# Patient Record
Sex: Male | Born: 1960 | Hispanic: Yes | Marital: Married | Smoking: Never smoker
Health system: Southern US, Community
[De-identification: ages and names within clinical notes are randomized; demographics above are authoritative.]

## PROBLEM LIST (undated history)

## (undated) ENCOUNTER — Emergency Department: Admission: EM | Payer: Self-pay

## (undated) DIAGNOSIS — I10 Essential (primary) hypertension: Secondary | ICD-10-CM

## (undated) HISTORY — PX: APPENDECTOMY: SHX54

---

## 2006-09-12 ENCOUNTER — Emergency Department: Payer: Self-pay

## 2010-03-02 ENCOUNTER — Ambulatory Visit: Payer: Self-pay | Admitting: Internal Medicine

## 2010-07-18 ENCOUNTER — Observation Stay: Payer: Self-pay | Admitting: Vascular Surgery

## 2010-07-22 LAB — PATHOLOGY REPORT

## 2011-05-23 IMAGING — US ABDOMEN ULTRASOUND
1 series · 17 of 25 positions shown · non-contrast
Comparison: none

REASON FOR EXAM: RUQ pain
COMMENTS:   May transport without cardiac monitor

[Series 1: abdomen ultrasound · 17 of 55 slices shown]
[im 1/55]
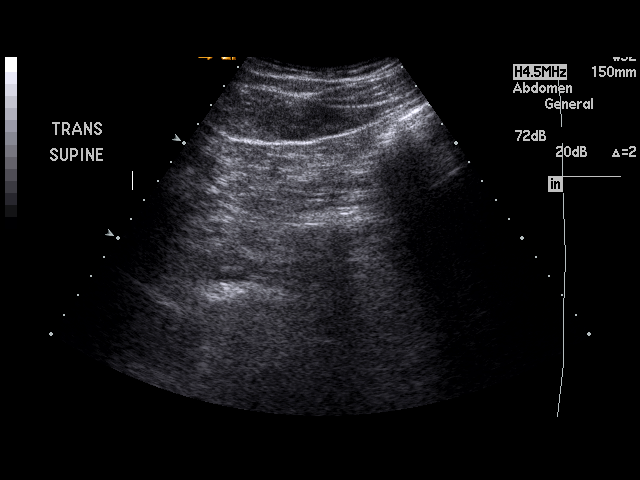
[im 5/55]
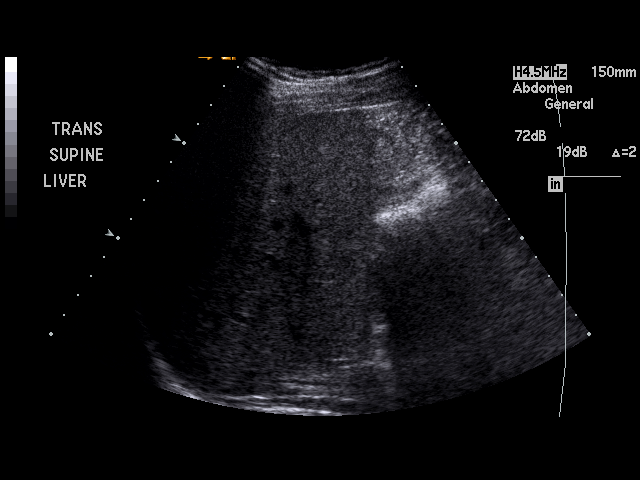
[im 7/55]
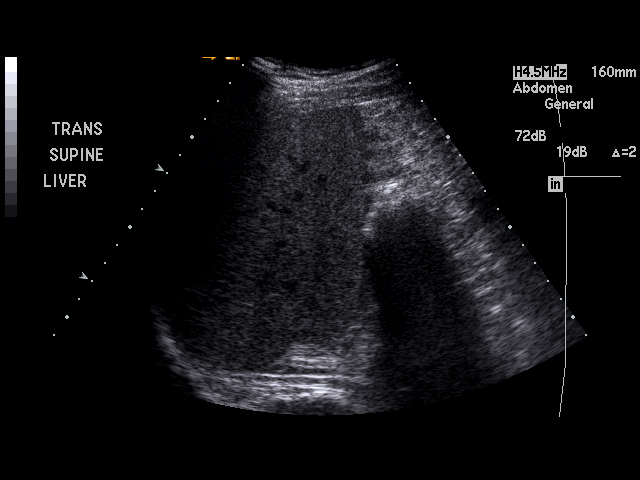
[im 12/55]
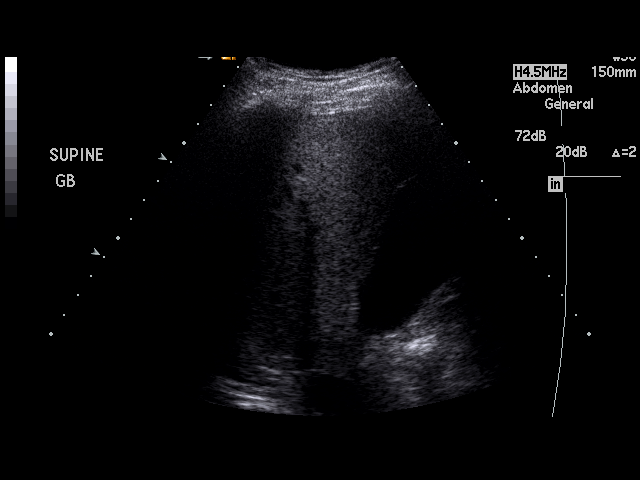
[im 14/55]
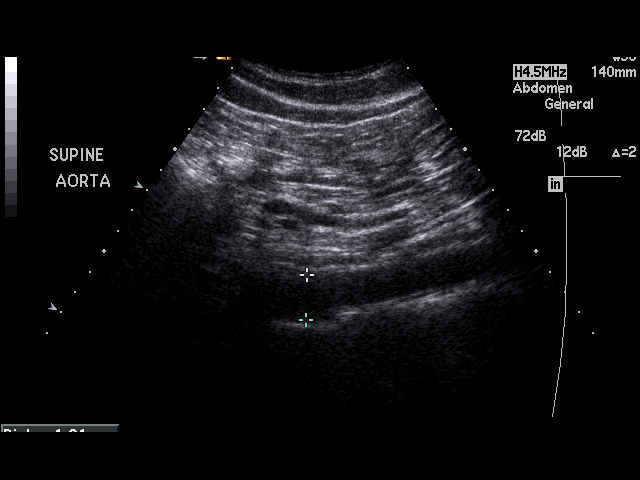
[im 19/55]
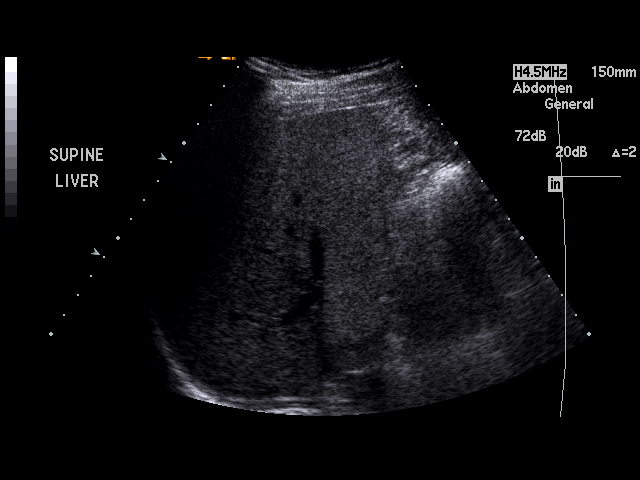
[im 21/55]
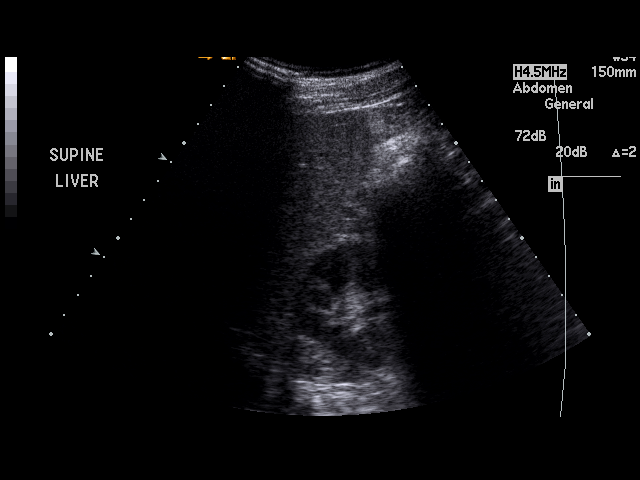
[im 25/55]
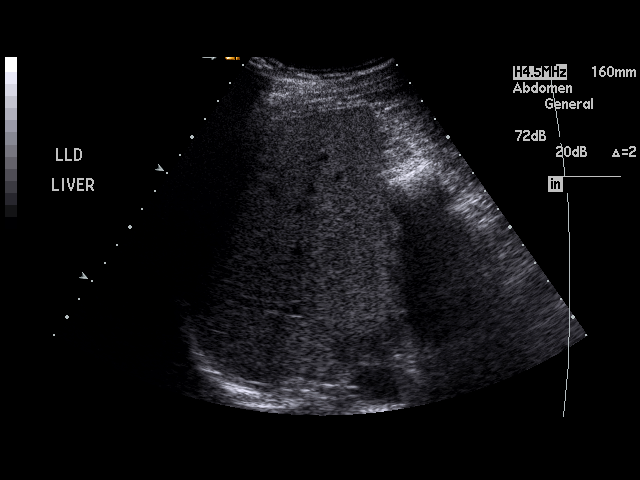
[im 28/55]
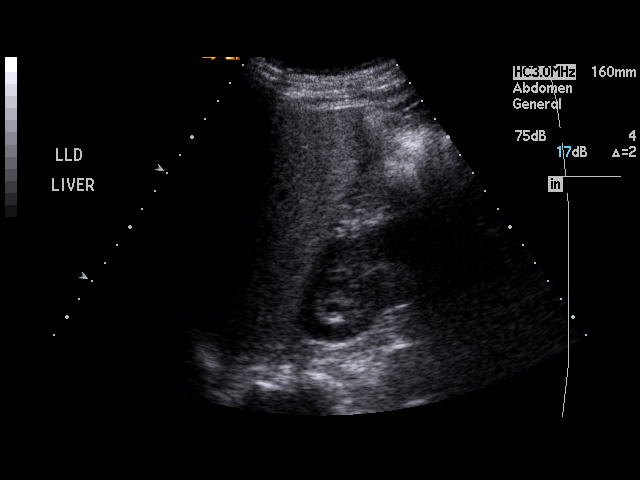
[im 30/55]
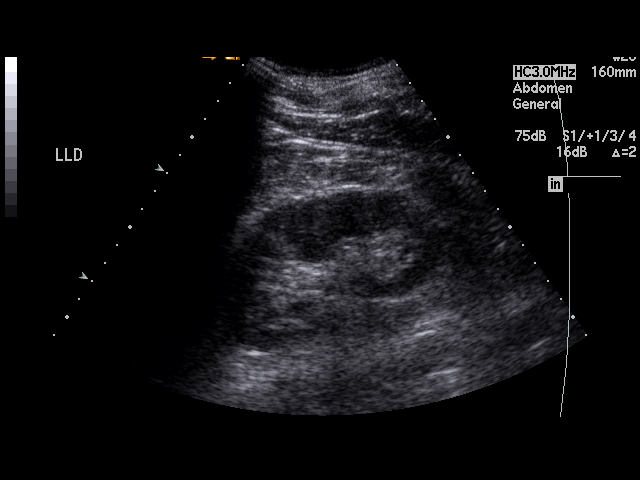
[im 34/55]
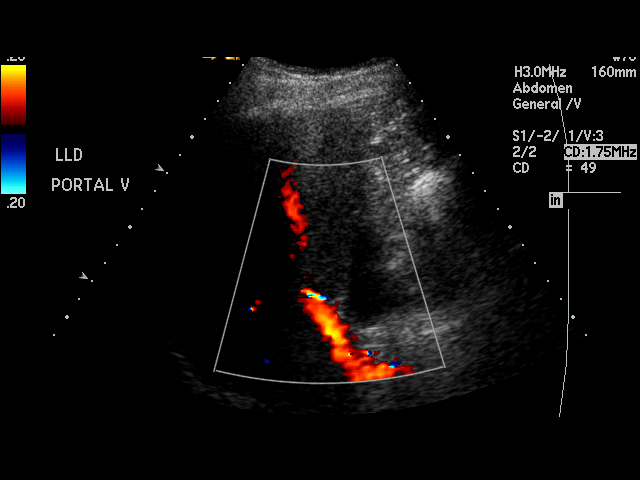
[im 37/55]
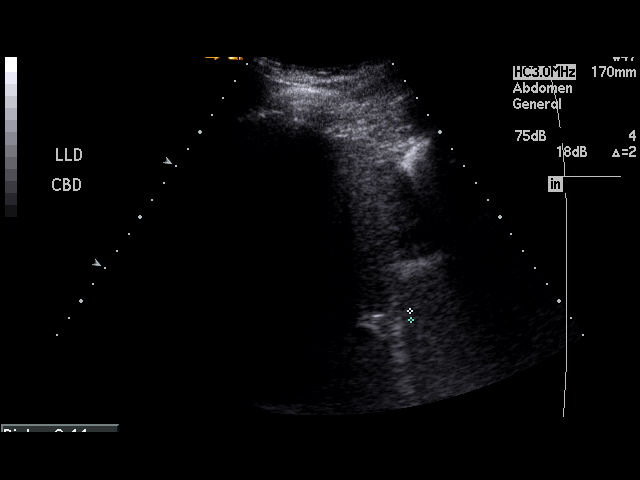
[im 41/55]
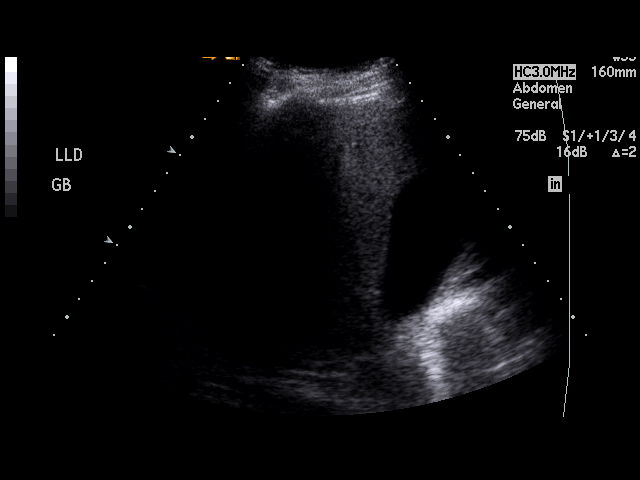
[im 43/55]
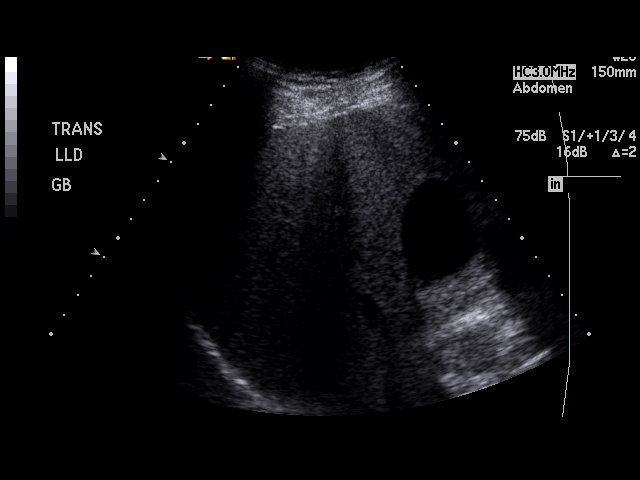
[im 48/55]
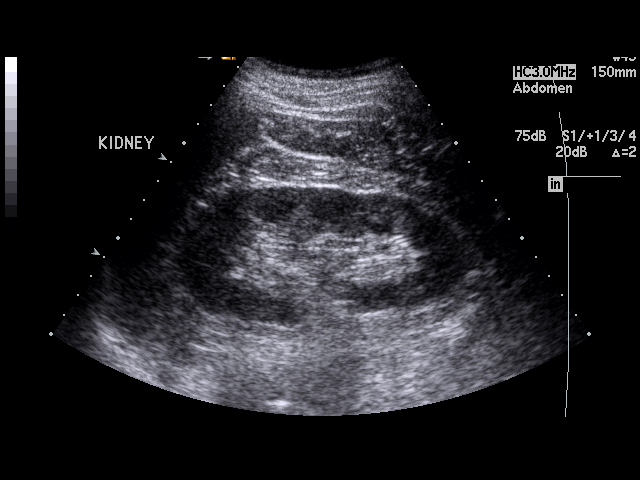
[im 50/55]
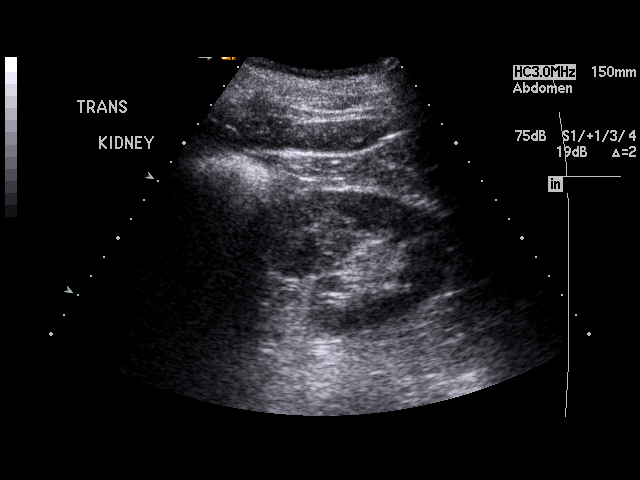
[im 55/55]
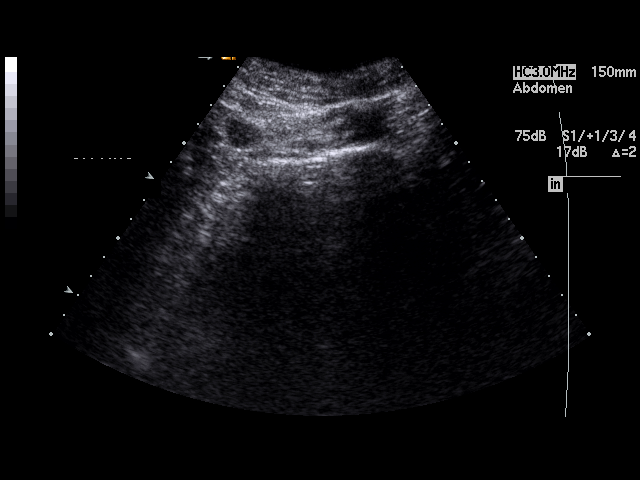

[17 of 25 positions shown; findings below may reference images not displayed]

PROCEDURE:     US  - US ABDOMEN GENERAL SURVEY  - July 17, 2010 [DATE]

RESULT:

The liver demonstrates no sonographic abnormalities. Hepatopetal flow is
identified within the portal vein. The aorta and IVC are unremarkable. The
pancreas is not visualized. Evaluation of the gallbladder fossa demonstrates
no evidence of pericholecystic fluid, gallstones or sludging. There is no
evidence of a sonographic Murphy's sign. Gallbladder wall thickness is
mm. The common bile duct measures 4.4 mm in diameter. Evaluation of the
kidneys demonstrates no evidence of hydronephrosis, masses nor calculi. The
right kidney measures 10.85 x 5.97 x 6.15 cm and the left 12.41 x 6.19 x
7.55 cm. The spleen demonstrates a homogeneous echotexture and measures
cm in longitudinal dimensions.
IMPRESSION: Unremarkable abdominal ultrasound.

Dr. Leftraru of the Emergency Department was informed of these findings via a
preliminary faxed report on 07/17/2010 at [DATE] p.m. Eastern Standard Time.

## 2015-08-24 ENCOUNTER — Emergency Department
Admission: EM | Admit: 2015-08-24 | Discharge: 2015-08-24 | Disposition: A | Discharge status: Home / Self Care | Payer: Self-pay | Attending: Emergency Medicine | Admitting: Emergency Medicine

## 2015-08-24 ENCOUNTER — Emergency Department: Payer: Self-pay

## 2015-08-24 DIAGNOSIS — J209 Acute bronchitis, unspecified: Secondary | ICD-10-CM | POA: Insufficient documentation

## 2015-08-24 MED ORDER — AZITHROMYCIN 250 MG PO TABS: ORAL_TABLET | ORAL | Status: DC

## 2015-08-24 MED ORDER — BENZONATATE 100 MG PO CAPS: 200.0000 mg | ORAL_CAPSULE | Freq: Three times a day (TID) | ORAL | Status: AC | PRN

## 2015-08-24 MED ORDER — PREDNISONE 10 MG PO TABS
ORAL_TABLET | ORAL | Status: DC
Start: 2015-08-24 — End: 2024-06-23

## 2015-08-24 NOTE — Discharge Instructions (Signed)
Bronquitis aguda °(Acute Bronchitis) °Se denomina bronquitis cuando las vías respiratorias que van desde la tráquea hasta los pulmones se irritan, se congestionan y duelen (se inflaman). La bronquitis generalmente produce flema espesa (mucosidad). Esto provoca tos. La tos es el síntoma más frecuente de la bronquitis. °Cuando la bronquitis es aguda, generalmente comienza de manera súbita y desaparece luego de algún tiempo (generalmente en 2 semanas). El hábito de fumar, las alergias y el asma pueden empeorar la bronquitis. Los episodios repetidos de bronquitis pueden causar más problemas pulmonares. °CUIDADOS EN EL HOGAR °· Reposo. °· Beba abundante cantidad de líquidos para mantener el pis orina) claro o amarillo pálido (excepto que debe limitar la ingesta de líquidos por indicación del médico). °· Tome sólo medicamentos de venta libre o recetados, según las indicaciones del médico. °· Evite fumar o aspirar el humo de otros fumadores. Esto puede empeorar la bronquitis. Si es fumador, considere el uso de chicles o parches en la piel de nicotina. Si deja de fumar, sus pulmones se curarán más rápido. °· Reduzca la probabilidad de enfermarse nuevamente de bronquitis de este modo: °¨ Lávese las manos con frecuencia. °¨ Evite las personas que tengan síntomas de resfrío. °¨ Trate de no llevarse las manos a la boca, la nariz o los ojos. °· Concurra a las consultas de control con el médico, según las indicaciones. °SOLICITE AYUDA SI: °Los síntomas no mejoran después de 1 semana de tratamiento. Los síntomas son: °· Tos. °· Fiebre. °· Eliminar moco espeso al toser. °· Dolores en el cuerpo. °· Congestión en el pecho. °· Escalofríos. °· Falta de aire. °· Dolor de garganta. °SOLICITE AYUDA DE INMEDIATO SI:  °· Le sube la fiebre. °· Tiene escalofríos. °· Comienza a sentir que le falta el aire de manera preocupante. °· Tiene expectoración con sangre (esputo). °· Devuelve (vomita) con frecuencia. °· Su organismo pierde mucho líquido  (deshidratación). °· Sufre un dolor intenso de cabeza. °· Se desmaya. °ASEGÚRESE DE QUE:  °· Comprende estas instrucciones. °· Controlará su afección. °· Recibirá ayuda de inmediato si no mejora o si empeora. °  °Esta información no tiene como fin reemplazar el consejo del médico. Asegúrese de hacerle al médico cualquier pregunta que tenga. °  °Document Released: 03/16/2013 °Elsevier Interactive Patient Education ©2016 [employer] ° °

## 2015-08-24 NOTE — ED Provider Notes (Signed)
[facility] Emergency Department Provider Note  ____________________________________________  Time seen: Approximately 9:01 AM  I have reviewed the triage vital signs and the nursing notes.   HISTORY  Chief Complaint Cough and Nasal Congestion  information obtained through the Spanish interpreter.  HPI Rithik Jeremy Ditullio is a 55 y.o. male is here complaining of yellow productive cough and chest congestion for 5 days. Patient states that he has had a fever at home that was subjective. Patient states he's been taking over-the-counter medication without any relief. He denies smoking.She rates his discomfort as a 7 out of 10. He is not sleeping at night secondary to the coughing.   History reviewed. No pertinent past medical history.  There are no active problems to display for this patient.   Past Surgical History  Procedure Laterality Date  . Appendectomy      Current Outpatient Rx  Name  Route  Sig  Dispense  Refill  . azithromycin (ZITHROMAX Z-PAK) 250 MG tablet      Take 2 tablets (500 mg) on  Day 1,  followed by 1 tablet (250 mg) once daily on Days 2 through 5.   6 each   0   . benzonatate (TESSALON PERLES) 100 MG capsule   Oral   Take 2 capsules (200 mg total) by mouth 3 (three) times daily as needed for cough.   40 capsule   0   . predniSONE (DELTASONE) 10 MG tablet      Take 3 tablets once a day for 3 days   9 tablet   0     Allergies Review of patient's allergies indicates no known allergies.  No family history on file.  Social History Social History  Substance Use Topics  . Smoking status: Never Smoker   . Smokeless tobacco: None  . Alcohol Use: No    Review of Systems Constitutional:  unaware fever/chills Eyes: No visual changes. ENT: No sore throat. Cardiovascular: Denies chest pain. Respiratory: Denies shortness of breath. Positive productive cough. Gastrointestinal: No abdominal pain.  No nausea, no vomiting.   No diarrhea.  No constipation. Musculoskeletal: Negative for back pain. Skin: Negative for rash. Neurological: Negative for headaches, focal weakness or numbness.  10-point ROS otherwise negative.  ____________________________________________   PHYSICAL EXAM:  VITAL SIGNS: ED Triage Vitals  Enc Vitals Group     BP 08/24/15 0839 152/76 mmHg     Pulse Rate 08/24/15 0839 99     Resp 08/24/15 0839 18     Temp 08/24/15 0839 98.6 F (37 C)     Temp Source 08/24/15 0839 Oral     SpO2 08/24/15 0839 97 %     Weight 08/24/15 0839 173 lb (78.472 kg)     Height 08/24/15 0839  (1.651 m)     Head Cir --      Peak Flow --      Pain Score 08/24/15 0843 7     Pain Loc --      Pain Edu? --      Excl. in GC? --     Constitutional: Alert and oriented. Well appearing and in no acute distress. Eyes: Conjunctivae are normal. PERRL. EOMI. Head: Atraumatic. Nose: No congestion/rhinnorhea.  He sees and TMs are clear bilaterally. Mouth/Throat: Mucous membranes are moist.  Oropharynx non-erythematous. Posterior drainage is present. Neck: No stridor.   Hematological/Lymphatic/Immunilogical: No cervical lymphadenopathy. Cardiovascular: Normal rate, regular rhythm. Grossly normal heart sounds.  Good peripheral circulation. Respiratory: Normal respiratory effort.  No retractions. Lungs CTAB. Occasional dry cough while in the emergency room, but no bronchospasms were observed. Gastrointestinal: Soft and nontender. No distention. Musculoskeletal: Moves upper and lower extremities without any difficulty. Normal gait was noted. Neurologic:  Normal speech and language. No gross focal neurologic deficits are appreciated. No gait instability. Skin:  Skin is warm, dry and intact. No rash noted. Psychiatric: Mood and affect are normal. Speech and behavior are normal.  ____________________________________________   LABS (all labs ordered are listed, but only abnormal results are displayed)  Labs  Reviewed - No data to display   RADIOLOGY  Chest x-ray per radiologist showed peribronchiolar the PVCs in the left lower lobe which could represent acute bronchitis or developing pneumonia. ____________________________________________   PROCEDURES  Procedure(s) performed: None  Critical Care performed: No  ____________________________________________   INITIAL IMPRESSION / ASSESSMENT AND PLAN / ED COURSE  Pertinent labs & imaging results that were available during my care of the patient were reviewed by me and considered in my medical decision making (see chart for details).  Patient was placed on Zithromax, Tessalon Perles, and prednisone 3 tablets once a day for 3 days. Patient is encouraged take Tylenol if needed for fever and to drink plenty of fluids. He was also given a note to remain out of work. He is to follow-up with his doctor at [facility] if possible and if not [facility]. ____________________________________________   FINAL CLINICAL IMPRESSION(S) / ED DIAGNOSES  Final diagnoses:  Acute bronchitis, unspecified organism      Tommi Rumps, PA-C 08/24/15 1226  Jennye Moccasin, MD 08/24/15 1340

## 2015-08-24 NOTE — ED Notes (Signed)
Pt c/o cough with sinus and chest congestion since Monday , states he has been taking OTC meds without any relief.

## 2016-06-29 IMAGING — CR DG CHEST 2V
1 series · 2 of 2 positions shown · non-contrast
Comparison: None.

CLINICAL DATA: Several days of persistent cough.

EXAM:
CHEST  2 VIEW

[Series 1: dg chest 2 view · 0.14mm/px · 2 of 2 slices shown]
[im 1/2]
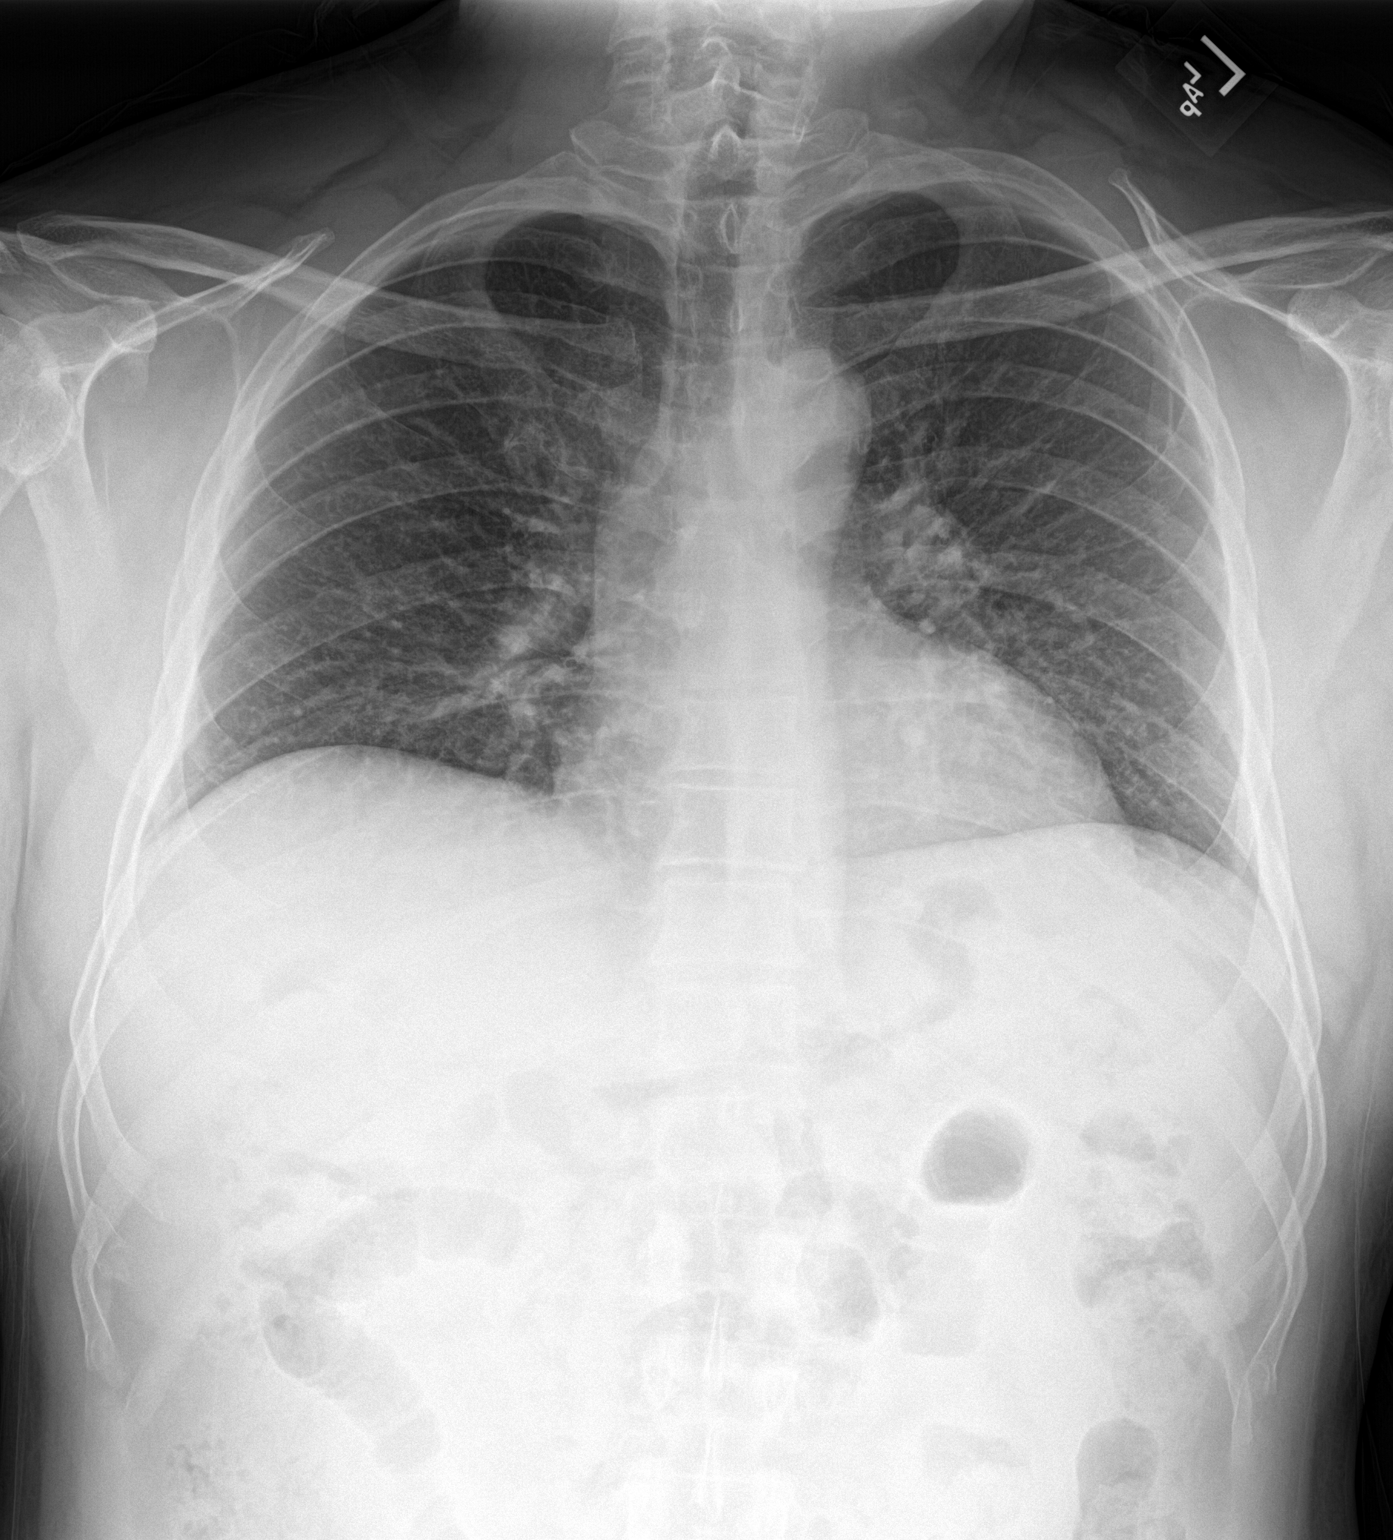
[im 2/2]
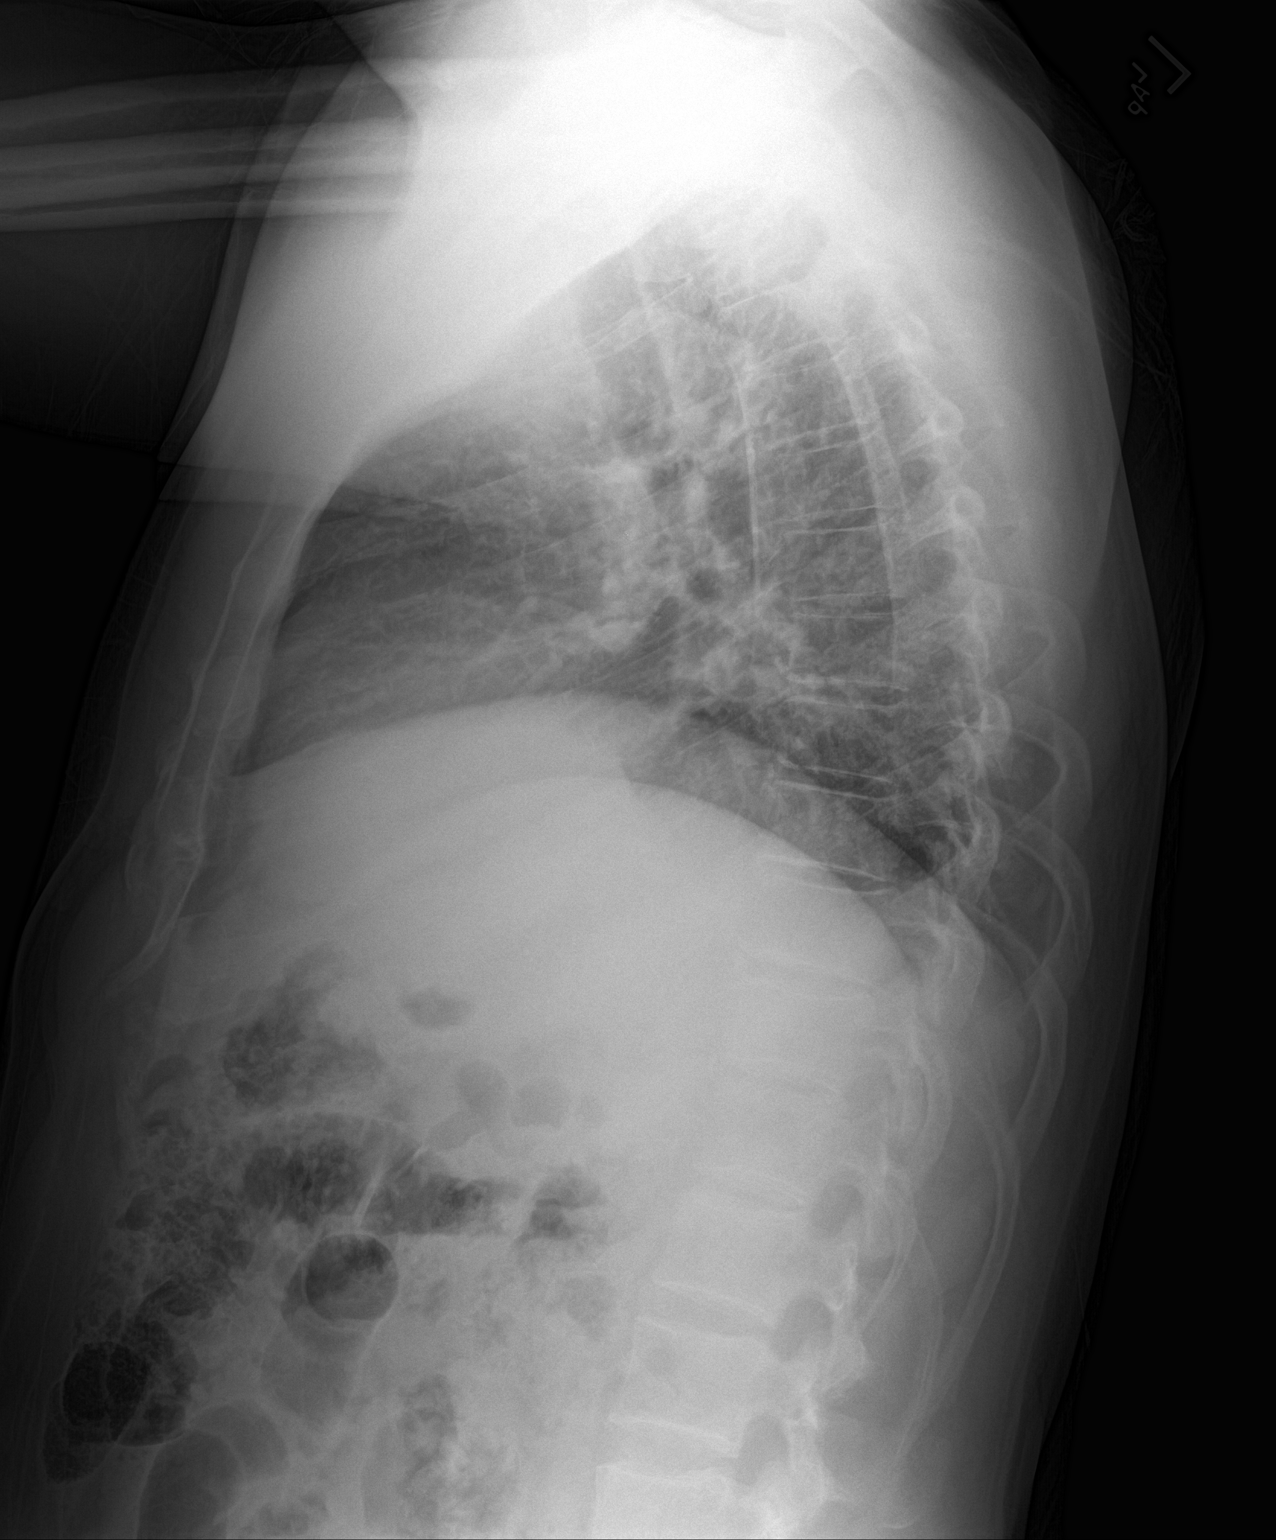

[2 of 2 positions shown; findings below may reference images not displayed]

FINDINGS: Cardiomediastinal silhouette is normal. Mediastinal contours appear
intact.

There is no evidence of pleural effusion or pneumothorax. Lung
volumes are low. Subtle peribronchovascular opacities are seen in
the left lower lobe.

Osseous structures are without acute abnormality. Soft tissues are
grossly normal.
IMPRESSION: Low lung volumes with subtle peribronchovascular opacities in the
left lower lobe, which may represent acute bronchitis or developing
pneumonia.

## 2022-06-24 ENCOUNTER — Ambulatory Visit
Admission: RE | Admit: 2022-06-24 | Discharge: 2022-06-24 | Disposition: A | Discharge status: Home / Self Care | Payer: BC Managed Care – PPO | Source: Ambulatory Visit | Attending: Obstetrics and Gynecology | Admitting: Obstetrics and Gynecology

## 2022-06-24 ENCOUNTER — Other Ambulatory Visit: Payer: Self-pay | Admitting: Obstetrics and Gynecology

## 2022-06-24 ENCOUNTER — Ambulatory Visit
Admission: RE | Admit: 2022-06-24 | Discharge: 2022-06-24 | Disposition: A | Discharge status: Home / Self Care | Payer: BC Managed Care – PPO | Attending: Obstetrics and Gynecology | Admitting: Obstetrics and Gynecology

## 2022-06-24 DIAGNOSIS — R053 Chronic cough: Secondary | ICD-10-CM

## 2024-06-22 ENCOUNTER — Other Ambulatory Visit: Payer: Self-pay

## 2024-06-22 ENCOUNTER — Emergency Department

## 2024-06-22 ENCOUNTER — Observation Stay
Admission: EM | Admit: 2024-06-22 | Discharge: 2024-06-23 | Disposition: A | Discharge status: Home / Self Care | Attending: Emergency Medicine | Admitting: Emergency Medicine

## 2024-06-22 DIAGNOSIS — E039 Hypothyroidism, unspecified: Secondary | ICD-10-CM | POA: Diagnosis not present

## 2024-06-22 DIAGNOSIS — I160 Hypertensive urgency: Secondary | ICD-10-CM | POA: Insufficient documentation

## 2024-06-22 DIAGNOSIS — G459 Transient cerebral ischemic attack, unspecified: Principal | ICD-10-CM | POA: Insufficient documentation

## 2024-06-22 DIAGNOSIS — R471 Dysarthria and anarthria: Secondary | ICD-10-CM | POA: Diagnosis not present

## 2024-06-22 DIAGNOSIS — Z7982 Long term (current) use of aspirin: Secondary | ICD-10-CM | POA: Diagnosis not present

## 2024-06-22 DIAGNOSIS — Z79899 Other long term (current) drug therapy: Secondary | ICD-10-CM | POA: Insufficient documentation

## 2024-06-22 DIAGNOSIS — E785 Hyperlipidemia, unspecified: Secondary | ICD-10-CM | POA: Diagnosis not present

## 2024-06-22 DIAGNOSIS — R4701 Aphasia: Secondary | ICD-10-CM | POA: Diagnosis present

## 2024-06-22 HISTORY — DX: Essential (primary) hypertension: I10

## 2024-06-22 LAB — CBC
HCT: 42.6 % (ref 39.0–52.0)
Hemoglobin: 14.1 g/dL (ref 13.0–17.0)
MCH: 29.1 pg (ref 26.0–34.0)
MCHC: 33.1 g/dL (ref 30.0–36.0)
MCV: 88 fL (ref 80.0–100.0)
Platelets: 192 K/uL (ref 150–400)
RBC: 4.84 MIL/uL (ref 4.22–5.81)
RDW: 14.1 % (ref 11.5–15.5)
WBC: 6.2 K/uL (ref 4.0–10.5)
nRBC: 0 % (ref 0.0–0.2)

## 2024-06-22 LAB — COMPREHENSIVE METABOLIC PANEL WITH GFR
ALT: 54 U/L — ABNORMAL HIGH (ref 0–44)
AST: 35 U/L (ref 15–41)
Albumin: 4.7 g/dL (ref 3.5–5.0)
Alkaline Phosphatase: 118 U/L (ref 38–126)
Anion gap: 11 (ref 5–15)
BUN: 20 mg/dL (ref 8–23)
CO2: 24 mmol/L (ref 22–32)
Calcium: 9.2 mg/dL (ref 8.9–10.3)
Chloride: 103 mmol/L (ref 98–111)
Creatinine, Ser: 0.87 mg/dL (ref 0.61–1.24)
GFR, Estimated: >60 mL/min (ref >60)
Glucose, Bld: 97 mg/dL (ref 70–99)
Potassium: 4 mmol/L (ref 3.5–5.1)
Sodium: 138 mmol/L (ref 135–145)
Total Bilirubin: 0.3 mg/dL (ref 0.0–1.2)
Total Protein: 7.9 g/dL (ref 6.5–8.1)

## 2024-06-22 LAB — DIFFERENTIAL
Abs Immature Granulocytes: 0.01 K/uL (ref 0.00–0.07)
Basophils Absolute: 0 K/uL (ref 0.0–0.1)
Basophils Relative: 1 %
Eosinophils Absolute: 0.3 K/uL (ref 0.0–0.5)
Eosinophils Relative: 5 %
Immature Granulocytes: 0 %
Lymphocytes Relative: 46 %
Lymphs Abs: 2.9 K/uL (ref 0.7–4.0)
Monocytes Absolute: 0.5 K/uL (ref 0.1–1.0)
Monocytes Relative: 8 %
Neutro Abs: 2.5 K/uL (ref 1.7–7.7)
Neutrophils Relative %: 40 %

## 2024-06-22 LAB — PROTIME-INR
INR: 0.9 (ref 0.8–1.2)
Prothrombin Time: 13.2 s (ref 11.4–15.2)

## 2024-06-22 LAB — APTT: aPTT: 32 s (ref 24–36)

## 2024-06-22 LAB — CBG MONITORING, ED: Glucose-Capillary: 82 mg/dL (ref 70–99)

## 2024-06-22 LAB — ETHANOL: Alcohol, Ethyl (B): <15 mg/dL (ref <15)

## 2024-06-22 MED ORDER — SODIUM CHLORIDE 0.9% FLUSH
3.0000 mL | Freq: Once | INTRAVENOUS | Status: AC
  Administered 2024-06-22: 3 mL via INTRAVENOUS

## 2024-06-22 MED ORDER — ASPIRIN 81 MG PO CHEW
324.0000 mg | CHEWABLE_TABLET | Freq: Once | ORAL | Status: AC
  Administered 2024-06-22: 324 mg via ORAL
  Filled 2024-06-22: qty 4

## 2024-06-22 NOTE — ED Triage Notes (Signed)
 Pt reports around 9:10 pm while he was on the phone he had slurred speech and difficulty finding his words. Symptoms last 10 minutes. No tx of stroke. Pt A&ox4. NIH 0 at this time   Pt requires spanish speaking interpreter

## 2024-06-22 NOTE — ED Provider Notes (Signed)
 [facility] Provider Note    Event Date/Time   First MD Initiated Contact with Patient 06/22/24 2301     (approximate)   History   Aphasia   HPI  Richard Wise is a 63 y.o. male with history of hypertension he went to lay down to go to bed around 9:05 PM last night when he felt that the right side of his face was drooping and numb and he had slurred speech when he was talking to his wife.  Symptoms lasted about 15 minutes and then resolved.  Checked his blood pressure to that time and systolic was 166.  States he feels fine now.  No headache or head injury.  Not on blood thinners.  No prior history of stroke or TIA.  No chest pain or shortness of breath.    History provided by patient, family using Spanish interpreter.    Past Medical History:  Diagnosis Date   Hypertension     Past Surgical History:  Procedure Laterality Date   APPENDECTOMY      MEDICATIONS:  Prior to Admission medications   Medication Sig Start Date End Date Taking? Authorizing Provider  azithromycin  (ZITHROMAX  Z-PAK) 250 MG tablet Take 2 tablets (500 mg) on  Day 1,  followed by 1 tablet (250 mg) once daily on Days 2 through 5. 08/24/15   Saunders Shona CROME, PA-C  predniSONE  (DELTASONE ) 10 MG tablet Take 3 tablets once a day for 3 days 08/24/15   Saunders Shona CROME, PA-C    Physical Exam   Triage Vital Signs: ED Triage Vitals  Encounter Vitals Group     BP 06/22/24 2221 (!) 188/79     Girls Systolic BP Percentile --      Girls Diastolic BP Percentile --      Boys Systolic BP Percentile --      Boys Diastolic BP Percentile --      Pulse Rate 06/22/24 2221 63     Resp 06/22/24 2221 18     Temp 06/22/24 2221 98.3 F (36.8 C)     Temp Source 06/22/24 2221 Oral     SpO2 06/22/24 2221 99 %     Weight 06/22/24 2219 184 lb (83.5 kg)     Height 06/22/24 2219 5' 7 (1.702 m)     Head Circumference --      Peak Flow --      Pain Score 06/22/24 2219 0     Pain Loc --       Pain Education --      Exclude from Growth Chart --     Most recent vital signs: Vitals:   06/22/24 2240 06/23/24 0101  BP: (!) 180/91 (!) 162/94  Pulse: 61 (!) 59  Resp: 18   Temp:  (!) 97.3 F (36.3 C)  SpO2: 99% 100%    CONSTITUTIONAL: Alert, responds appropriately to questions. Well-appearing; well-nourished HEAD: Normocephalic, atraumatic EYES: Conjunctivae clear, pupils appear equal, sclera nonicteric ENT: normal nose; moist mucous membranes NECK: Supple, normal ROM CARD: RRR; S1 and S2 appreciated RESP: Normal chest excursion without splinting or tachypnea; breath sounds clear and equal bilaterally; no wheezes, no rhonchi, no rales, no hypoxia or respiratory distress, speaking full sentences ABD/GI: Non-distended; soft, non-tender, no rebound, no guarding, no peritoneal signs BACK: The back appears normal EXT: Normal ROM in all joints; no deformity noted, no edema SKIN: Normal color for age and race; warm; no rash on exposed skin NEURO: Moves all extremities equally, normal  speech, no drift, cranial nerves II through XII intact, normal sensation diffusely PSYCH: The patient's mood and manner are appropriate.   ED Results / Procedures / Treatments   LABS: (all labs ordered are listed, but only abnormal results are displayed) Labs Reviewed  COMPREHENSIVE METABOLIC PANEL WITH GFR - Abnormal; Notable for the following components:      Result Value   ALT 54 (*)    All other components within normal limits  PROTIME-INR  APTT  CBC  DIFFERENTIAL  ETHANOL  HIV ANTIBODY (ROUTINE TESTING W REFLEX)  LIPID PANEL  HEMOGLOBIN A1C  CBC  CREATININE, SERUM  CBG MONITORING, ED  I-STAT CREATININE, ED     EKG:  EKG Interpretation Date/Time:  Wednesday June 22 2024 22:22:03 EST Ventricular Rate:  66 PR Interval:  162 QRS Duration:  96 QT Interval:  372 QTC Calculation: 389 R Axis:   -18  Text Interpretation: Normal sinus rhythm Incomplete right bundle branch  block Borderline ECG When compared with ECG of 17-Jul-2010 21:35, Vent. rate has decreased BY  32 BPM No significant change since last tracing Confirmed by Neomi Neptune [phone]) on 06/22/2024 11:02:26 PM         RADIOLOGY: My personal review and interpretation of imaging: CT head shows no acute abnormality.  MRI brain negative for stroke  I have personally reviewed all radiology reports.   MR BRAIN WO CONTRAST Result Date: 06/23/2024 CLINICAL DATA:  Initial evaluation for acute neuro deficit, stroke suspected, TIA. EXAM: MRI HEAD WITHOUT CONTRAST TECHNIQUE: Multiplanar, multiecho pulse sequences of the brain and surrounding structures were obtained without intravenous contrast. COMPARISON:  CT from 06/22/2024. FINDINGS: Brain: Cerebral volume within normal limits for age. No focal parenchymal signal abnormality. No abnormal foci of restricted diffusion to suggest acute or subacute ischemia. Gray-white matter differentiation well maintained. No encephalomalacia to suggest chronic cortical infarction or other insult. No foci of susceptibility artifact indicative of acute or chronic intracranial blood products. No mass lesion, midline shift or mass effect. Ventricles normal in size and morphology without hydrocephalus. No extra-axial fluid collection. Pituitary gland and suprasellar region within normal limits. Vascular: Major intracranial vascular flow voids are well maintained. Skull and upper cervical spine: Craniocervical junction within normal limits. Visualized upper cervical spine demonstrates no significant finding. Bone marrow signal intensity within normal limits. No scalp soft tissue abnormality. Sinuses/Orbits: Globes and orbital soft tissues are within normal limits. Paranasal sinuses are largely clear. No significant mastoid effusion. Other: None. IMPRESSION: Normal brain MRI. No acute intracranial abnormality identified. Electronically Signed   By: Morene Hoard M.D.   On:  06/23/2024 01:37   CT HEAD WO CONTRAST Result Date: 06/22/2024 EXAM: CT HEAD WITHOUT CONTRAST 06/22/2024 10:37:03 PM TECHNIQUE: CT of the head was performed without the administration of intravenous contrast. Automated exposure control, iterative reconstruction, and/or weight based adjustment of the mA/kV was utilized to reduce the radiation dose to as low as reasonably achievable. COMPARISON: 09/12/2006 CLINICAL HISTORY: Slurred Speech. FINDINGS: BRAIN AND VENTRICLES: No acute hemorrhage. No evidence of acute infarct. No hydrocephalus. No extra-axial collection. No mass effect or midline shift. ORBITS: No acute abnormality. SINUSES: No acute abnormality. SOFT TISSUES AND SKULL: No acute soft tissue abnormality. No skull fracture. IMPRESSION: 1. No acute intracranial abnormality. Electronically signed by: Franky Crease MD 06/22/2024 10:41 PM EST RP Workstation: [id number]     PROCEDURES:  Critical Care performed: No     .1-3 Lead EKG Interpretation  Performed by: Mashelle Busick, Neptune SAILOR, DO Authorized by: Aharon Carriere, Neptune SAILOR,  DO     Interpretation: normal     ECG rate:  59   ECG rate assessment: normal     Rhythm: sinus rhythm     Ectopy: none     Conduction: normal       IMPRESSION / MDM / ASSESSMENT AND PLAN / ED COURSE  I reviewed the triage vital signs and the nursing notes.    Patient here with complaints of right-sided facial droop and numbness with slurred speech that lasted for 15 minutes and has resolved.  The patient is on the cardiac monitor to evaluate for evidence of arrhythmia and/or significant heart rate changes.   DIFFERENTIAL DIAGNOSIS (includes but not limited to):   TIA, CVA, complex migraine, doubt intracranial hemorrhage, CVT, meningitis   Patient's presentation is most consistent with acute presentation with potential threat to life or bodily function.   PLAN: Labs show no significant abnormality.  Normal hemoglobin, electrolytes.  CT head reviewed and  interpreted by myself and radiologist and shows no acute abnormality.  Recommended MRI brain and admission for TIA workup.  Patient and family agreeable to this plan.  He is not a tPA candidate as he is currently asymptomatic with NIH stroke scale of 0.  Will give full dose aspirin .   MEDICATIONS GIVEN IN ED: Medications   stroke: early stages of recovery book (has no administration in time range)  0.9 %  sodium chloride  infusion ( Intravenous New Bag/Given 06/23/24 0200)  acetaminophen  (TYLENOL ) tablet 650 mg (has no administration in time range)    Or  acetaminophen  (TYLENOL ) 160 MG/5ML solution 650 mg (has no administration in time range)    Or  acetaminophen  (TYLENOL ) suppository 650 mg (has no administration in time range)  senna-docusate (Senokot-S) tablet 1 tablet (has no administration in time range)  enoxaparin  (LOVENOX ) injection 40 mg (has no administration in time range)  aspirin  suppository 300 mg (has no administration in time range)    Or  aspirin  tablet 325 mg (has no administration in time range)  traZODone  (DESYREL ) tablet 25 mg (has no administration in time range)  ondansetron  (ZOFRAN ) injection 4 mg (has no administration in time range)  sodium chloride  flush (NS) 0.9 % injection 3 mL (3 mLs Intravenous Given 06/22/24 2242)  aspirin  chewable tablet 324 mg (324 mg Oral Given 06/22/24 2345)  iohexol  (OMNIPAQUE ) 350 MG/ML injection 75 mL (75 mLs Intravenous Contrast Given 06/23/24 0240)     ED COURSE:  Consulted and discussed patient's case with hospitalist, Dr. Lawence.  I have recommended admission and consulting physician agrees and will place admission orders.  Patient (and family if present) agree with this plan.   I reviewed all nursing notes, vitals, pertinent previous records.  All labs, EKGs, imaging ordered have been independently reviewed and interpreted by myself.       OUTSIDE RECORDS REVIEWED: Reviewed recent outpatient medicine  notes.       FINAL CLINICAL IMPRESSION(S) / ED DIAGNOSES   Final diagnoses:  TIA (transient ischemic attack)     Rx / DC Orders   ED Discharge Orders     None        Note:  This document was prepared using Dragon voice recognition software and may include unintentional dictation errors.   Oluwatomiwa Kinyon, Josette SAILOR, DO 06/23/24 [phone]

## 2024-06-22 NOTE — ED Notes (Signed)
 PT to CT.

## 2024-06-22 NOTE — ED Notes (Signed)
 NO code stroke per EDP Fernand

## 2024-06-23 ENCOUNTER — Observation Stay
Admit: 2024-06-23 | Discharge: 2024-06-23 | Disposition: A | Discharge status: Home / Self Care | Attending: Family Medicine | Admitting: Family Medicine

## 2024-06-23 ENCOUNTER — Encounter: Payer: Self-pay | Admitting: Family Medicine

## 2024-06-23 ENCOUNTER — Observation Stay

## 2024-06-23 DIAGNOSIS — G459 Transient cerebral ischemic attack, unspecified: Secondary | ICD-10-CM | POA: Diagnosis not present

## 2024-06-23 DIAGNOSIS — E039 Hypothyroidism, unspecified: Secondary | ICD-10-CM

## 2024-06-23 DIAGNOSIS — I160 Hypertensive urgency: Secondary | ICD-10-CM

## 2024-06-23 DIAGNOSIS — E785 Hyperlipidemia, unspecified: Secondary | ICD-10-CM

## 2024-06-23 LAB — CBC
HCT: 40.8 % (ref 39.0–52.0)
Hemoglobin: 13.6 g/dL (ref 13.0–17.0)
MCH: 29.2 pg (ref 26.0–34.0)
MCHC: 33.3 g/dL (ref 30.0–36.0)
MCV: 87.7 fL (ref 80.0–100.0)
Platelets: 178 K/uL (ref 150–400)
RBC: 4.65 MIL/uL (ref 4.22–5.81)
RDW: 14 % (ref 11.5–15.5)
WBC: 5.1 K/uL (ref 4.0–10.5)
nRBC: 0 % (ref 0.0–0.2)

## 2024-06-23 LAB — LIPID PANEL
Cholesterol: 188 mg/dL (ref 0–200)
HDL: 40 mg/dL — ABNORMAL LOW (ref >40)
LDL Cholesterol: 125 mg/dL — ABNORMAL HIGH (ref 0–99)
Total CHOL/HDL Ratio: 4.7 ratio
Triglycerides: 114 mg/dL (ref <150)
VLDL: 23 mg/dL (ref 0–40)

## 2024-06-23 LAB — CREATININE, SERUM
Creatinine, Ser: 0.76 mg/dL (ref 0.61–1.24)
GFR, Estimated: >60 mL/min (ref >60)

## 2024-06-23 LAB — ECHOCARDIOGRAM COMPLETE BUBBLE STUDY
Area-P 1/2: 3.61 cm2
S' Lateral: 2.1 cm

## 2024-06-23 LAB — HIV ANTIBODY (ROUTINE TESTING W REFLEX): HIV Screen 4th Generation wRfx: NONREACTIVE

## 2024-06-23 MED ORDER — ACETAMINOPHEN 160 MG/5ML PO SOLN: 650.0000 mg | ORAL | Status: DC | PRN

## 2024-06-23 MED ORDER — SENNOSIDES-DOCUSATE SODIUM 8.6-50 MG PO TABS: 1.0000 | ORAL_TABLET | Freq: Every evening | ORAL | Status: DC | PRN

## 2024-06-23 MED ORDER — TRAZODONE HCL 50 MG PO TABS: 25.0000 mg | ORAL_TABLET | Freq: Every evening | ORAL | Status: DC | PRN

## 2024-06-23 MED ORDER — IOHEXOL 350 MG/ML SOLN
75.0000 mL | Freq: Once | INTRAVENOUS | Status: AC | PRN
  Administered 2024-06-23: 75 mL via INTRAVENOUS

## 2024-06-23 MED ORDER — ONDANSETRON HCL 4 MG/2ML IJ SOLN
4.0000 mg | INTRAMUSCULAR | Status: DC | PRN
Start: 2024-06-23 — End: 2024-06-23

## 2024-06-23 MED ORDER — ACETAMINOPHEN 650 MG RE SUPP: 650.0000 mg | RECTAL | Status: DC | PRN

## 2024-06-23 MED ORDER — CLOPIDOGREL BISULFATE 75 MG PO TABS: 75.0000 mg | ORAL_TABLET | Freq: Every day | ORAL | 0 refills | Status: AC

## 2024-06-23 MED ORDER — STROKE: EARLY STAGES OF RECOVERY BOOK: Freq: Once | Status: DC

## 2024-06-23 MED ORDER — ASPIRIN 300 MG RE SUPP: 300.0000 mg | Freq: Every day | RECTAL | Status: DC

## 2024-06-23 MED ORDER — ATORVASTATIN CALCIUM 20 MG PO TABS: 40.0000 mg | ORAL_TABLET | Freq: Every day | ORAL | Status: DC

## 2024-06-23 MED ORDER — SODIUM CHLORIDE 0.9 % IV SOLN: INTRAVENOUS | Status: DC

## 2024-06-23 MED ORDER — CLOPIDOGREL BISULFATE 75 MG PO TABS
300.0000 mg | ORAL_TABLET | Freq: Once | ORAL | Status: AC
Start: 2024-06-23 — End: 2024-06-23
  Administered 2024-06-23: 300 mg via ORAL
  Filled 2024-06-23: qty 4

## 2024-06-23 MED ORDER — ASPIRIN 81 MG PO TBEC: 81.0000 mg | DELAYED_RELEASE_TABLET | Freq: Every day | ORAL | 0 refills | Status: AC

## 2024-06-23 MED ORDER — ASPIRIN 325 MG PO TABS
325.0000 mg | ORAL_TABLET | Freq: Every day | ORAL | Status: DC
  Administered 2024-06-23: 325 mg via ORAL
  Filled 2024-06-23: qty 1

## 2024-06-23 MED ORDER — ATORVASTATIN CALCIUM 20 MG PO TABS
20.0000 mg | ORAL_TABLET | Freq: Every day | ORAL | Status: DC
  Administered 2024-06-23: 20 mg via ORAL
  Filled 2024-06-23: qty 1

## 2024-06-23 MED ORDER — ACETAMINOPHEN 325 MG PO TABS: 650.0000 mg | ORAL_TABLET | ORAL | Status: DC | PRN

## 2024-06-23 MED ORDER — CLOPIDOGREL BISULFATE 75 MG PO TABS: 75.0000 mg | ORAL_TABLET | Freq: Every day | ORAL | Status: DC

## 2024-06-23 MED ORDER — ATORVASTATIN CALCIUM 40 MG PO TABS: 40.0000 mg | ORAL_TABLET | Freq: Every day | ORAL | 0 refills | Status: AC

## 2024-06-23 MED ORDER — ENOXAPARIN SODIUM 40 MG/0.4ML IJ SOSY
40.0000 mg | PREFILLED_SYRINGE | INTRAMUSCULAR | Status: DC
  Administered 2024-06-23: 40 mg via SUBCUTANEOUS
  Filled 2024-06-23: qty 0.4

## 2024-06-23 NOTE — ED Notes (Signed)
Admit TIA

## 2024-06-23 NOTE — Hospital Course (Addendum)
 Hospital course / significant events:   HPI: Richard Wise is a 63 y.o. male with medical history significant for essential hypertension, hypothyroidism and dyslipidemia, who presented to the ED with acute onset of right facial droop as well as slurred speech and right facial numbness around 21:05 06/22/24 - episode lasted about 15 minutes and resolved.   11/26: admitted to hospitalist for TIA. CTA H/N and MRI brain ordered. Neuro consult ordered 11/27: MRI brain ok, CTA no LVO though did show some stenosis R ICA and L M1 no need for vascular consult or outpatient follow up at this. Echo no concerns. neuro ok for dc on DAPT + statin and follow w/ PCP     Consultants:  neurology  Procedures/Surgeries: none      ASSESSMENT & PLAN:   TIA DAPT x21d then ASA 81 alone Atorvastatin  A1C pending at time of dc PCP to follow (pt states he was recentyl checked and didn't hear about results, he states if he doesn't hear anything that usually means labs were fine but would still have him confirm w/ PCP)

## 2024-06-23 NOTE — H&P (Addendum)
 [place]   PATIENT NAME: Richard Wise    MR#:  [id number]  DATE OF BIRTH:  Mar 14, 1961  DATE OF ADMISSION:  06/22/2024  PRIMARY CARE PHYSICIAN: Kandis Devaughn Sayres, MD   Patient is coming from: Home  REQUESTING/REFERRING PHYSICIAN: Ward, Josette SAILOR, DO  CHIEF COMPLAINT:   Chief Complaint  Patient presents with   Aphasia    HISTORY OF PRESENT ILLNESS:  Christoper Santi Troung is a 63 y.o. male with medical history significant for essential hypertension, hypothyroidism and dyslipidemia, who presented to the emergency room with acute onset of right facial droop as well as slurred speech and right facial numbness around 9: Oh 5 PM last night.  He denied any headache or dizziness or blurred vision.  No tinnitus or vertigo.  No upper or lower extremity paresthesias or muscle weakness.  This episode lasted about 15 minutes and resolved.  His blood pressure was elevated with a systolic BP of 166 at that time.  He denied taking any blood thinners.  No history of bleeding diathesis.  No chest pain or palpitations.  No cough or wheezing or dyspnea.  No nausea vomiting or abdominal pain.  ED Course: When he came to the ER, BP was 188/79 with otherwise normal vital signs.  Labs revealed an ALT of 54 with otherwise unremarkable CMP.  CBC was normal. EKG as reviewed by me : EKG showed normal sinus rhythm with a rate of 66 with incomplete right bundle branch block. Imaging: Noncontrasted head CT scan revealed no acute intracranial abnormalities.  CTA of the head and neck Revealed atheromatous change about the right carotid bulb with estimated short segment 50% stenosis at the origin of the cervical right ICA, moderate proximal left MCA stenosis and additional mild for age atheromatous change about the major arterial vasculature of the head and neck with negative CTA for large vessel occlusion or other emergent finding.  Brain MRI came back negative.  The patient was given 4 baby  aspirin .  He will be admitted to a medical telemetry observation bed for further evaluation and management. PAST MEDICAL HISTORY:  Essential hypertension, hypothyroidism and dyslipidemia.  PAST SURGICAL HISTORY:   Past Surgical History:  Procedure Laterality Date   APPENDECTOMY      SOCIAL HISTORY:   Social History   Tobacco Use   Smoking status: Never   Smokeless tobacco: Not on file  Substance Use Topics   Alcohol use: No    FAMILY HISTORY:   He did not recall medical problems in his family and did not seem familiar with his family history.  DRUG ALLERGIES:  No Known Allergies  REVIEW OF SYSTEMS:   ROS As per history of present illness. All pertinent systems were reviewed above. Constitutional, HEENT, cardiovascular, respiratory, GI, GU, musculoskeletal, neuro, psychiatric, endocrine, integumentary and hematologic systems were reviewed and are otherwise negative/unremarkable except for positive findings mentioned above in the HPI.   MEDICATIONS AT HOME:   Prior to Admission medications   Medication Sig Start Date End Date Taking? Authorizing Provider  azithromycin  (ZITHROMAX  Z-PAK) 250 MG tablet Take 2 tablets (500 mg) on  Day 1,  followed by 1 tablet (250 mg) once daily on Days 2 through 5. 08/24/15   Saunders Shona CROME, PA-C  predniSONE  (DELTASONE ) 10 MG tablet Take 3 tablets once a day for 3 days 08/24/15   Saunders Shona CROME, PA-C      VITAL SIGNS:  Blood pressure 128/66, pulse (!) 57, temperature (!) 97.3  F (36.3 C), temperature source Oral, resp. rate 18, height 5' 7 (1.702 m), weight 86.7 kg, SpO2 100%.  PHYSICAL EXAMINATION:  Physical Exam  GENERAL:  63 y.o.-year-old Hispanic male patient lying in the bed with no acute distress.  EYES: Pupils equal, round, reactive to light and accommodation. No scleral icterus. Extraocular muscles intact.  HEENT: Head atraumatic, normocephalic. Oropharynx and nasopharynx clear.  NECK:  Supple, no jugular venous  distention. No thyroid enlargement, no tenderness.  LUNGS: Normal breath sounds bilaterally, no wheezing, rales,rhonchi or crepitation. No use of accessory muscles of respiration.  CARDIOVASCULAR: Regular rate and rhythm, S1, S2 normal. No murmurs, rubs, or gallops.  ABDOMEN: Soft, nondistended, nontender. Bowel sounds present. No organomegaly or mass.  EXTREMITIES: No pedal edema, cyanosis, or clubbing.  NEUROLOGIC: Cranial nerves II through XII are intact. Muscle strength 5/5 in all extremities. Sensation intact. Gait not checked.  PSYCHIATRIC: The patient is alert and oriented x 3.  Normal affect and good eye contact. SKIN: No obvious rash, lesion, or ulcer.   LABORATORY PANEL:   CBC Recent Labs  Lab 06/22/24 2223  WBC 6.2  HGB 14.1  HCT 42.6  PLT 192   ------------------------------------------------------------------------------------------------------------------  Chemistries  Recent Labs  Lab 06/22/24 2223  NA 138  K 4.0  CL 103  CO2 24  GLUCOSE 97  BUN 20  CREATININE 0.87  CALCIUM  9.2  AST 35  ALT 54*  ALKPHOS 118  BILITOT 0.3   ------------------------------------------------------------------------------------------------------------------  Cardiac Enzymes No results for input(s): TROPONINI in the last 168 hours. ------------------------------------------------------------------------------------------------------------------  RADIOLOGY:  CT ANGIO HEAD NECK W WO CM Result Date: 06/23/2024 CLINICAL DATA:  Initial evaluation for acute TIA, aphasia. EXAM: CT ANGIOGRAPHY HEAD AND NECK WITH AND WITHOUT CONTRAST TECHNIQUE: Multidetector CT imaging of the head and neck was performed using the standard protocol during bolus administration of intravenous contrast. Multiplanar CT image reconstructions and MIPs were obtained to evaluate the vascular anatomy. Carotid stenosis measurements (when applicable) are obtained utilizing NASCET criteria, using the distal  internal carotid diameter as the denominator. RADIATION DOSE REDUCTION: This exam was performed according to the departmental dose-optimization program which includes automated exposure control, adjustment of the mA and/or kV according to patient size and/or use of iterative reconstruction technique. CONTRAST:  75mL OMNIPAQUE  IOHEXOL  350 MG/ML SOLN COMPARISON:  Comparison made with brain MRI performed earlier the same day as well as prior CT from 06/22/2024. FINDINGS: CTA NECK FINDINGS Aortic arch: Visualized aortic arch within normal limits for caliber. Bovine branching pattern noted. Mild aortic atherosclerosis. No significant stenosis about the origin the great vessels. Right carotid system: Right common and internal carotid arteries are patent without dissection. Atheromatous change about the right carotid bulb with estimated short-segment 50% stenosis at the origin of the cervical right ICA (series 6, image 210). Left carotid system: Left common and internal carotid arteries are patent without dissection. Mild atheromatous change about the left carotid bulb without hemodynamically significant greater than 50% stenosis. Vertebral arteries: Both vertebral arteries arise from subclavian arteries. No proximal subclavian artery stenosis. Left vertebral artery dominant. Vertebral arteries are patent without stenosis or dissection. Skeleton: No worrisome osseous lesions. Other neck: No other acute finding. Upper chest: No other acute finding. Review of the MIP images confirms the above findings CTA HEAD FINDINGS Anterior circulation: Mild atheromatous change about the carotid siphons without hemodynamically significant stenosis. A1 segments patent bilaterally. Normal anterior communicating artery complex. Anterior cerebral arteries patent without significant stenosis. Moderate somewhat long segment stenosis involving the proximal  left M1 segment (series 7, image 22). Right M1 widely patent. No proximal MCA branch  occlusion or high-grade stenosis. Distal MCA branches perfused and symmetric. Posterior circulation: Atheromatous plaque within the right V4 segment without hemodynamically significant stenosis. Left V4 segment widely patent. Left PICA patent. Right PICA origin not well seen. Basilar patent without stenosis. Superior cerebral arteries patent bilaterally. Both PCAs primarily supplied via the basilar and are patent to their distal aspects without significant stenosis. Venous sinuses: Patent allowing for timing the contrast bolus. Anatomic variants: None significant.  No aneurysm. Review of the MIP images confirms the above findings IMPRESSION: 1. Negative CTA for large vessel occlusion or other emergent finding. 2. Atheromatous change about the right carotid bulb with estimated short-segment 50% stenosis at the origin of the cervical right ICA. 3. Moderate proximal left M1 stenosis. 4. Additional mild for age atheromatous change elsewhere about the major arterial vasculature of the head and neck as above. No other hemodynamically significant or correctable stenosis. Aortic Atherosclerosis (ICD10-I70.0). Electronically Signed   By: Morene Hoard M.D.   On: 06/23/2024 03:41   MR BRAIN WO CONTRAST Result Date: 06/23/2024 CLINICAL DATA:  Initial evaluation for acute neuro deficit, stroke suspected, TIA. EXAM: MRI HEAD WITHOUT CONTRAST TECHNIQUE: Multiplanar, multiecho pulse sequences of the brain and surrounding structures were obtained without intravenous contrast. COMPARISON:  CT from 06/22/2024. FINDINGS: Brain: Cerebral volume within normal limits for age. No focal parenchymal signal abnormality. No abnormal foci of restricted diffusion to suggest acute or subacute ischemia. Gray-white matter differentiation well maintained. No encephalomalacia to suggest chronic cortical infarction or other insult. No foci of susceptibility artifact indicative of acute or chronic intracranial blood products. No mass  lesion, midline shift or mass effect. Ventricles normal in size and morphology without hydrocephalus. No extra-axial fluid collection. Pituitary gland and suprasellar region within normal limits. Vascular: Major intracranial vascular flow voids are well maintained. Skull and upper cervical spine: Craniocervical junction within normal limits. Visualized upper cervical spine demonstrates no significant finding. Bone marrow signal intensity within normal limits. No scalp soft tissue abnormality. Sinuses/Orbits: Globes and orbital soft tissues are within normal limits. Paranasal sinuses are largely clear. No significant mastoid effusion. Other: None. IMPRESSION: Normal brain MRI. No acute intracranial abnormality identified. Electronically Signed   By: Morene Hoard M.D.   On: 06/23/2024 01:37   CT HEAD WO CONTRAST Result Date: 06/22/2024 EXAM: CT HEAD WITHOUT CONTRAST 06/22/2024 10:37:03 PM TECHNIQUE: CT of the head was performed without the administration of intravenous contrast. Automated exposure control, iterative reconstruction, and/or weight based adjustment of the mA/kV was utilized to reduce the radiation dose to as low as reasonably achievable. COMPARISON: 09/12/2006 CLINICAL HISTORY: Slurred Speech. FINDINGS: BRAIN AND VENTRICLES: No acute hemorrhage. No evidence of acute infarct. No hydrocephalus. No extra-axial collection. No mass effect or midline shift. ORBITS: No acute abnormality. SINUSES: No acute abnormality. SOFT TISSUES AND SKULL: No acute soft tissue abnormality. No skull fracture. IMPRESSION: 1. No acute intracranial abnormality. Electronically signed by: Franky Crease MD 06/22/2024 10:41 PM EST RP Workstation: [id number]      IMPRESSION AND PLAN:  Assessment and Plan: * TIA (transient ischemic attack) - This was manifested by dysarthria and right facial droop and numbness. - The patient will be admitted to an observation medically monitored bed.   - We will follow neuro checks  q.4 hours for 24 hours.   - The patient will be placed on aspirin .   - Will obtain 2D echo with bubble study .   -  A neurology consultation  as well as physical/occupation/speech therapy consults will be obtained in a.m.SABRA   - The patient will be placed on statin therapy and fasting lipids will be checked.   Hypertensive urgency - Will continue antihypertensive therapy and allow permissive hypertension.  Hypothyroidism - Will continue Synthroid.  Dyslipidemia - This is apparently diet managed. - Will check fasting lipids. - He will be started on statin therapy   DVT prophylaxis: Lovenox .  Advanced Care Planning:  Code Status: full code.  Family Communication:  The plan of care was discussed in details with the patient (and family). I answered all questions. The patient agreed to proceed with the above mentioned plan. Further management will depend upon hospital course. Disposition Plan: Back to previous home environment Consults called: none.  All the records are reviewed and case discussed with ED provider.  Status is: Observation  I certify that at the time of admission, it is my clinical judgment that the patient will require inpatient hospital care extending more than 2 midnights.                            Dispo: The patient is from: Home              Anticipated d/c is to: Home              Patient currently is not medically stable to d/c.              Difficult to place patient: No  Madison DELENA Peaches M.D on 06/23/2024 at 5:28 AM  [facility]   From 7 PM-7 AM, contact night-coverage [email or web address]  CC: Primary care physician; Kandis Devaughn Sayres, MD

## 2024-06-23 NOTE — Assessment & Plan Note (Signed)
-   Will continue antihypertensive therapy and allow permissive hypertension.

## 2024-06-23 NOTE — Assessment & Plan Note (Signed)
-   This was manifested by dysarthria and right facial droop and numbness. - The patient will be admitted to an observation medically monitored bed.   - We will follow neuro checks q.4 hours for 24 hours.   - The patient will be placed on aspirin .   - Will obtain 2D echo with bubble study .   - A neurology consultation  as well as physical/occupation/speech therapy consults will be obtained in a.m.SABRA   - The patient will be placed on statin therapy and fasting lipids will be checked.

## 2024-06-23 NOTE — Assessment & Plan Note (Signed)
-   This is apparently diet managed. - Will check fasting lipids. - He will be started on statin therapy

## 2024-06-23 NOTE — Assessment & Plan Note (Signed)
-   Will continue Synthroid .

## 2024-06-23 NOTE — Consult Note (Signed)
 NEUROLOGY CONSULT NOTE   Date of service: June 23, 2024 Patient Name: Richard Wise MRN:  [id number] DOB:  03-25-1961 Chief Complaint: facial weakness and dysarthria Requesting Provider: Marsa Edelman, DO  History of Present Illness  Richard Wise is a 63 y.o. male with hx of hypertension who presents with transient right facial weakness and numbness.  He states that the right side of his face felt asleep, and he was slurring his speech.  It lasted for approximately 15 minutes and then resolved.  LKW: 9:10 PM Modified rankin score: 0-Completely asymptomatic and back to baseline post- stroke IV Thrombolysis: No, resolution of symptoms EVT: No, resolution of symptoms NIHSS: 0 Past History   Past Medical History:  Diagnosis Date   Hypertension     Past Surgical History:  Procedure Laterality Date   APPENDECTOMY      Family History: No family history on file.  Social History  reports that he has never smoked. He does not have any smokeless tobacco history on file. He reports that he does not drink alcohol. No history on file for drug use.  No Known Allergies  Medications   Current Facility-Administered Medications:    [START ON 06/24/2024]  stroke: early stages of recovery book, , Does not apply, Once, Mansy, Jan A, MD   0.9 %  sodium chloride  infusion, , Intravenous, Continuous, Mansy, Jan A, MD, Last Rate: 100 mL/hr at 06/23/24 0200, New Bag at 06/23/24 0200   acetaminophen  (TYLENOL ) tablet 650 mg, 650 mg, Oral, Q4H PRN **OR** acetaminophen  (TYLENOL ) 160 MG/5ML solution 650 mg, 650 mg, Per Tube, Q4H PRN **OR** acetaminophen  (TYLENOL ) suppository 650 mg, 650 mg, Rectal, Q4H PRN, Mansy, Jan A, MD   aspirin  suppository 300 mg, 300 mg, Rectal, Daily **OR** aspirin  tablet 325 mg, 325 mg, Oral, Daily, Mansy, Jan A, MD, 325 mg at 06/23/24 0909   atorvastatin  (LIPITOR) tablet 20 mg, 20 mg, Oral, Daily, Mansy, Jan A, MD, 20 mg at 06/23/24 0909   enoxaparin   (LOVENOX ) injection 40 mg, 40 mg, Subcutaneous, Q24H, Mansy, Jan A, MD, 40 mg at 06/23/24 0909   ondansetron  (ZOFRAN ) injection 4 mg, 4 mg, Intravenous, Q4H PRN, Mansy, Jan A, MD   senna-docusate (Senokot-S) tablet 1 tablet, 1 tablet, Oral, QHS PRN, Mansy, Jan A, MD   traZODone  (DESYREL ) tablet 25 mg, 25 mg, Oral, QHS PRN, Mansy, Jan A, MD  Vitals   Vitals:   06/22/24 2240 06/23/24 0101 06/23/24 0408 06/23/24 0734  BP: (!) 180/91 (!) 162/94 128/66 133/70  Pulse: 61 (!) 59 (!) 57 (!) 57  Resp: 18   18  Temp:  (!) 97.3 F (36.3 C) (!) 97.3 F (36.3 C)   TempSrc:  Oral Oral   SpO2: 99% 100% 100% 100%  Weight:  86.7 kg    Height:  5' 7 (1.702 m)      Body mass index is 29.94 kg/m.   Physical Exam   Constitutional: Appears well-developed and well-nourished.   Neurologic Examination    Neuro: Mental Status: Patient is awake, alert, oriented to person, place, month, year, and situation. Patient is able to give a clear and coherent history. No signs of aphasia or neglect Cranial Nerves: II: Visual Fields are full. Pupils are equal, round, and reactive to light.   III,IV, VI: EOMI without ptosis or diploplia.  V: Facial sensation is symmetric to temperature VII: Facial movement is symmetric.  VIII: hearing is intact to voice X: Uvula elevates symmetrically XII: tongue is midline without atrophy  or fasciculations.  Motor: Tone is normal. Bulk is normal. 5/5 strength was present in all four extremities.  Sensory: Sensation is symmetric to light touch and temperature in the arms and legs. Cerebellar: FNF intact bilaterally   Labs/Imaging/Neurodiagnostic studies   CBC:  Recent Labs  Lab 2024/07/05 2223 06/23/24 0552  WBC 6.2 5.1  NEUTROABS 2.5  --   HGB 14.1 13.6  HCT 42.6 40.8  MCV 88.0 87.7  PLT 192 178   Basic Metabolic Panel:  Lab Results  Component Value Date   NA 138 07-05-2024   K 4.0 Jul 05, 2024   CO2 24 07/05/2024   GLUCOSE 97 07/05/2024   BUN 20  2024/07/05   CREATININE 0.76 06/23/2024   CALCIUM  9.2 07-05-2024   GFRNONAA >60 06/23/2024   Lipid Panel:  Lab Results  Component Value Date   LDLCALC 125 (H) 06/23/2024   HgbA1c: No results found for: HGBA1C Urine Drug Screen: No results found for: LABOPIA, COCAINSCRNUR, LABBENZ, AMPHETMU, THCU, LABBARB  Alcohol Level     Component Value Date/Time   [facility] <15 2024-07-05 2223   INR  Lab Results  Component Value Date   INR 0.9 05-Jul-2024   APTT  Lab Results  Component Value Date   APTT 32 2024/07/05    CT Head without contrast(Personally reviewed): Negative  CT angio Head and Neck with contrast(Personally reviewed): Mild r ICA amd L M1 stenosis.   Echo and telemetry are without embolic source  ASSESSMENT   Richard Wise is a 63 y.o. male with transient right facial weakness and numbness, most consistent with TIA.  He will be started on dual antiplatelet therapy and will need Plavix  for 3 weeks followed by aspirin  monotherapy.  RECOMMENDATIONS  Follow-up A1c, goal less than seven Aspirin  81 mg and Plavix  75 mg after 300 mg load for 3 weeks followed by aspirin  81 mg daily Lipitor 40 mg, goal less than 70 He can follow-up with PCP as outpatient ______________________________________________________________________    Signed, Aisha Seals, MD [facility]

## 2024-06-23 NOTE — Evaluation (Signed)
 Physical Therapy Evaluation Patient Details Name: Richard Wise MRN: [id number] DOB: [birth year] Today's Date: 06/23/2024  History of Present Illness  63 y.o. male with hx of hypertension who presents with transient right facial weakness and numbness.  He states that the right side of his face felt asleep, and he was slurring his speech.  It lasted for approximately 15 minutes and then resolved.  Clinical Impression  Pt did very well with PT exam, appears to be back to his baseline with all strength, mobility, coordination, balance, sensation, etc.  He showed exceptional strength that was = b/l and reports that he feels back to his baseline.  This is confirmed by multiple members of the family that were present during eval. Encouraged pt to continue to remain active, no further PT needs.  Will complete PT orders.       If plan is discharge home, recommend the following:     Can travel by private vehicle        Equipment Recommendations None recommended by PT  Recommendations for Other Services       Functional Status Assessment Patient has not had a recent decline in their functional status     Precautions / Restrictions Precautions Precautions: None Restrictions Weight Bearing Restrictions Per Provider Order: No      Mobility  Bed Mobility Overal bed mobility: Independent                  Transfers Overall transfer level: Independent Equipment used: None               General transfer comment: easily transitions to standing w/o issue    Ambulation/Gait Ambulation/Gait assistance: Independent Gait Distance (Feet): 250 Feet Assistive device: None         General Gait Details: able to maintain safe and confident cadence w/o issue, no LOBs or safety concerns, VSS - pt and son report he looks baseline  Careers Information Officer     Tilt Bed    Modified Rankin (Stroke Patients Only)       Balance Overall balance  assessment: Independent                                           Pertinent Vitals/Pain Pain Assessment Pain Assessment: No/denies pain    Home Living Family/patient expects to be discharged to:: Private residence Living Arrangements: Spouse/significant other Available Help at Discharge: Family;Available 24 hours/day   Home Access: Stairs to enter Entrance Stairs-Rails:  (yes) Entrance Stairs-Number of Steps: 5            Prior Function Prior Level of Function : Independent/Modified Independent             Mobility Comments: goes to the gym QD, active, driving, etc ADLs Comments: independent     Extremity/Trunk Assessment   Upper Extremity Assessment Upper Extremity Assessment: Overall WFL for tasks assessed (= b/l and very strong)    Lower Extremity Assessment Lower Extremity Assessment: Overall WFL for tasks assessed       Communication   Communication Communication: Impaired Factors Affecting Communication: Non - English speaking, interpreter not available (tele-interp [place] #[id number])    Cognition Arousal: Alert Behavior During Therapy: WFL for tasks assessed/performed   PT - Cognitive impairments: No apparent impairments  Following commands: Intact       Cueing Cueing Techniques: Verbal cues     General Comments      Exercises     Assessment/Plan    PT Assessment Patient does not need any further PT services  PT Problem List         PT Treatment Interventions      PT Goals (Current goals can be found in the Care Plan section)  Acute Rehab PT Goals Patient Stated Goal: get back to lifting a the gym PT Goal Formulation: All assessment and education complete, DC therapy    Frequency       Co-evaluation               AM-PAC PT 6 Clicks Mobility  Outcome Measure Help needed turning from your back to your side while in a flat bed without using bedrails?: None Help needed  moving from lying on your back to sitting on the side of a flat bed without using bedrails?: None Help needed moving to and from a bed to a chair (including a wheelchair)?: None Help needed standing up from a chair using your arms (e.g., wheelchair or bedside chair)?: None Help needed to walk in hospital room?: None Help needed climbing 3-5 steps with a railing? : None 6 Click Score: 24    End of Session   Activity Tolerance: Patient tolerated treatment well Patient left: in bed;with call bell/phone within reach;with family/visitor present Nurse Communication: Mobility status PT Visit Diagnosis: Other symptoms and signs involving the nervous system (R29.898)    Time: 8791-8775 PT Time Calculation (min) (ACUTE ONLY): 16 min   Charges:   PT Evaluation $PT Eval Low Complexity: 1 Low   PT General Charges $$ ACUTE PT VISIT: 1 Visit         Carmin JONELLE Deed, DPT 06/23/2024, 1:23 PM

## 2024-06-23 NOTE — Discharge Summary (Signed)
 Physician Discharge Summary   Patient: Richard Wise MRN: [id number]  DOB: 07/03/1961   Admit:     Date of Admission: 06/22/2024 Admitted from: home   Discharge: Date of discharge: 06/23/24 Disposition: Home Condition at discharge: good  CODE STATUS: FULL CODE     Discharge Physician: Richard Blunt, DO [facility]     PCP: Richard Devaughn Sayres, MD  Recommendations for Outpatient Follow-up:  Follow up with PCP in 2-4 weeks Please obtain labs/tests: CBC, CMP, lipids in 4-6 weeks Please follow up on the following pending results: A1C PCP AND OTHER OUTPATIENT PROVIDERS: SEE BELOW FOR SPECIFIC DISCHARGE INSTRUCTIONS PRINTED FOR PATIENT IN ADDITION TO GENERIC AVS PATIENT INFO     Discharge Instructions     Diet - low sodium heart healthy   Complete by: As directed    Increase activity slowly   Complete by: As directed          Discharge Diagnoses: Principal Problem:   TIA (transient ischemic attack) Active Problems:   Hypertensive urgency   Hypothyroidism   Dyslipidemia       Hospital course / significant events:   HPI: Richard Wise is a 63 y.o. male with medical history significant for essential hypertension, hypothyroidism and dyslipidemia, who presented to the ED with acute onset of right facial droop as well as slurred speech and right facial numbness around 21:05 06/22/24 - episode lasted about 15 minutes and resolved.   11/26: admitted to hospitalist for TIA. CTA H/N and MRI brain ordered. Neuro consult ordered 11/27: MRI brain ok, CTA no LVO though did show some stenosis R ICA and L M1 no need for vascular consult or outpatient follow up at this. Echo no concerns. neuro ok for dc on DAPT + statin and follow w/ PCP     Consultants:  neurology  Procedures/Surgeries: none      ASSESSMENT & PLAN:   TIA DAPT x21d then ASA 81 alone Atorvastatin  A1C pending at time of dc PCP to follow (pt states he was  recentyl checked and didn't hear about results, he states if he doesn't hear anything that usually means labs were fine but would still have him confirm w/ PCP)             Discharge Instructions  Allergies as of 06/23/2024   No Known Allergies      Medication List     TAKE these medications    aspirin  EC 81 MG tablet Take 1 tablet (81 mg total) by mouth daily. Swallow whole.   atorvastatin  40 MG tablet Commonly known as: LIPITOR Take 1 tablet (40 mg total) by mouth daily. Start taking on: June 24, 2024   clopidogrel  75 MG tablet Commonly known as: PLAVIX  Take 1 tablet (75 mg total) by mouth daily. Start taking on: June 24, 2024          No Known Allergies   Subjective: interpreter used. Pt feeling great, no new weakness, no other concerns. All family questions answered    Discharge Exam: BP (!) 145/91 (BP Location: Left Arm)   Pulse (!) 59   Temp 97.9 F (36.6 C) (Oral)   Resp 16   Ht 5' 7 (1.702 m)   Wt 86.7 kg   SpO2 100%   BMI 29.94 kg/m  General: Pt is alert, awake, not in acute distress Cardiovascular: RRR, S1/S2 +, no rubs, no gallops Respiratory: CTA bilaterally, no wheezing, no rhonchi Abdominal: Soft, NT, ND, bowel sounds + Extremities:  no edema, no cyanosis     The results of significant diagnostics from this hospitalization (including imaging, microbiology, ancillary and laboratory) are listed below for reference.     Microbiology: No results found for this or any previous visit (from the past 240 hours).   Labs: BNP (last 3 results) No results for input(s): BNP in the last 8760 hours. Basic Metabolic Panel: Recent Labs  Lab 06/22/24 2223 06/23/24 0552  NA 138  --   K 4.0  --   CL 103  --   CO2 24  --   GLUCOSE 97  --   BUN 20  --   CREATININE 0.87 0.76  CALCIUM  9.2  --    Liver Function Tests: Recent Labs  Lab 06/22/24 2223  AST 35  ALT 54*  ALKPHOS 118  BILITOT 0.3  PROT 7.9  ALBUMIN 4.7    No results for input(s): LIPASE, AMYLASE in the last 168 hours. No results for input(s): AMMONIA in the last 168 hours. CBC: Recent Labs  Lab 06/22/24 2223 06/23/24 0552  WBC 6.2 5.1  NEUTROABS 2.5  --   HGB 14.1 13.6  HCT 42.6 40.8  MCV 88.0 87.7  PLT 192 178   Cardiac Enzymes: No results for input(s): CKTOTAL, CKMB, CKMBINDEX, TROPONINI in the last 168 hours. BNP: Invalid input(s): POCBNP CBG: Recent Labs  Lab 06/22/24 2227  GLUCAP 82   D-Dimer No results for input(s): DDIMER in the last 72 hours. Hgb A1c No results for input(s): HGBA1C in the last 72 hours. Lipid Profile Recent Labs    06/23/24 0552  CHOL 188  HDL 40*  LDLCALC 125*  TRIG 114  CHOLHDL 4.7   Thyroid function studies No results for input(s): TSH, T4TOTAL, T3FREE, THYROIDAB in the last 72 hours.  Invalid input(s): FREET3 Anemia work up No results for input(s): VITAMINB12, FOLATE, FERRITIN, TIBC, IRON, RETICCTPCT in the last 72 hours. Urinalysis No results found for: COLORURINE, APPEARANCEUR, LABSPEC, PHURINE, GLUCOSEU, HGBUR, BILIRUBINUR, KETONESUR, PROTEINUR, UROBILINOGEN, NITRITE, LEUKOCYTESUR Sepsis Labs Recent Labs  Lab 06/22/24 2223 06/23/24 0552  WBC 6.2 5.1   Microbiology No results found for this or any previous visit (from the past 240 hours). Imaging ECHOCARDIOGRAM COMPLETE BUBBLE STUDY Result Date: 06/23/2024    ECHOCARDIOGRAM REPORT   Patient Name:   Richard Wise [facility] Date of Exam: 06/23/2024 Medical Rec #:  [id number]              Height:       67.0 in Accession #:    [id number]             Weight:       191.1 lb Date of Birth:  01-27-1961              BSA:          1.984 m Patient Age:    63 years               BP:           128/66 mmHg Patient Gender: M                      HR:           50 bpm. Exam Location:  [facility] Procedure: 2D Echo, Cardiac Doppler, Color Doppler and Saline Contrast Bubble             Study (Both Spectral and Color Flow Doppler were utilized during  procedure). Indications:     Stroke  History:         Patient has no prior history of Echocardiogram examinations.                  Risk Factors:Dyslipidemia.  Sonographer:     Philomena Daring Referring Phys:  [id number] GJW A MANSY Diagnosing Phys: Deatrice Cage MD IMPRESSIONS  1. Left ventricular ejection fraction, by estimation, is 60 to 65%. The left ventricle has normal function. The left ventricle has no regional wall motion abnormalities. Left ventricular diastolic parameters were normal.  2. Right ventricular systolic function is normal. The right ventricular size is normal. There is normal pulmonary artery systolic pressure.  3. The mitral valve is normal in structure. No evidence of mitral valve regurgitation. No evidence of mitral stenosis.  4. The aortic valve is normal in structure. Aortic valve regurgitation is not visualized. No aortic stenosis is present.  5. The inferior vena cava is normal in size with greater than 50% respiratory variability, suggesting right atrial pressure of 3 mmHg.  6. Agitated saline contrast bubble study was negative, with no evidence of any interatrial shunt. FINDINGS  Left Ventricle: Left ventricular ejection fraction, by estimation, is 60 to 65%. The left ventricle has normal function. The left ventricle has no regional wall motion abnormalities. The left ventricular internal cavity size was normal in size. There is  borderline left ventricular hypertrophy. Left ventricular diastolic parameters were normal. Right Ventricle: The right ventricular size is normal. No increase in right ventricular wall thickness. Right ventricular systolic function is normal. There is normal pulmonary artery systolic pressure. The tricuspid regurgitant velocity is 1.83 m/s, and  with an assumed right atrial pressure of 3 mmHg, the estimated right ventricular systolic pressure is 16.4 mmHg. Left Atrium: Left atrial size was  normal in size. Right Atrium: Right atrial size was normal in size. Pericardium: There is no evidence of pericardial effusion. Mitral Valve: The mitral valve is normal in structure. No evidence of mitral valve regurgitation. No evidence of mitral valve stenosis. Tricuspid Valve: The tricuspid valve is normal in structure. Tricuspid valve regurgitation is mild . No evidence of tricuspid stenosis. Aortic Valve: The aortic valve is normal in structure. Aortic valve regurgitation is not visualized. No aortic stenosis is present. Pulmonic Valve: The pulmonic valve was normal in structure. Pulmonic valve regurgitation is not visualized. No evidence of pulmonic stenosis. Aorta: The aortic root is normal in size and structure. Venous: The inferior vena cava is normal in size with greater than 50% respiratory variability, suggesting right atrial pressure of 3 mmHg. IAS/Shunts: No atrial level shunt detected by color flow Doppler. Agitated saline contrast was given intravenously to evaluate for intracardiac shunting. Agitated saline contrast bubble study was negative, with no evidence of any interatrial shunt.  LEFT VENTRICLE PLAX 2D LVIDd:         3.20 cm   Diastology LVIDs:         2.10 cm   LV e' medial:    8.27 cm/s LV PW:         1.00 cm   LV E/e' medial:  9.8 LV IVS:        1.20 cm   LV e' lateral:   10.00 cm/s LVOT diam:     2.00 cm   LV E/e' lateral: 8.1 LV SV:         62 LV SV Index:   31 LVOT Area:     3.14 cm  RIGHT  VENTRICLE             IVC RV S prime:     12.80 cm/s  IVC diam: 1.70 cm TAPSE (M-mode): 2.9 cm LEFT ATRIUM             Index        RIGHT ATRIUM           Index LA diam:        3.70 cm 1.87 cm/m   RA Area:     13.40 cm LA Vol (A2C):   37.6 ml 18.95 ml/m  RA Volume:   27.40 ml  13.81 ml/m LA Vol (A4C):   35.2 ml 17.74 ml/m LA Biplane Vol: 39.0 ml 19.66 ml/m  AORTIC VALVE LVOT Vmax:   77.60 cm/s LVOT Vmean:  50.300 cm/s LVOT VTI:    0.196 m  AORTA Ao Root diam: 2.60 cm MITRAL VALVE                TRICUSPID VALVE MV Area (PHT): 3.61 cm    TR Peak grad:   13.4 mmHg MV Decel Time: 210 msec    TR Vmax:        183.00 cm/s MV E velocity: 80.70 cm/s MV A velocity: 75.50 cm/s  SHUNTS MV E/A ratio:  1.07        Systemic VTI:  0.20 m                            Systemic Diam: 2.00 cm Deatrice Cage MD Electronically signed by Deatrice Cage MD Signature Date/Time: 06/23/2024/11:26:15 AM    Final    CT ANGIO HEAD NECK W WO CM Result Date: 06/23/2024 CLINICAL DATA:  Initial evaluation for acute TIA, aphasia. EXAM: CT ANGIOGRAPHY HEAD AND NECK WITH AND WITHOUT CONTRAST TECHNIQUE: Multidetector CT imaging of the head and neck was performed using the standard protocol during bolus administration of intravenous contrast. Multiplanar CT image reconstructions and MIPs were obtained to evaluate the vascular anatomy. Carotid stenosis measurements (when applicable) are obtained utilizing NASCET criteria, using the distal internal carotid diameter as the denominator. RADIATION DOSE REDUCTION: This exam was performed according to the departmental dose-optimization program which includes automated exposure control, adjustment of the mA and/or kV according to patient size and/or use of iterative reconstruction technique. CONTRAST:  75mL OMNIPAQUE  IOHEXOL  350 MG/ML SOLN COMPARISON:  Comparison made with brain MRI performed earlier the same day as well as prior CT from 06/22/2024. FINDINGS: CTA NECK FINDINGS Aortic arch: Visualized aortic arch within normal limits for caliber. Bovine branching pattern noted. Mild aortic atherosclerosis. No significant stenosis about the origin the great vessels. Right carotid system: Right common and internal carotid arteries are patent without dissection. Atheromatous change about the right carotid bulb with estimated short-segment 50% stenosis at the origin of the cervical right ICA (series 6, image 210). Left carotid system: Left common and internal carotid arteries are patent without  dissection. Mild atheromatous change about the left carotid bulb without hemodynamically significant greater than 50% stenosis. Vertebral arteries: Both vertebral arteries arise from subclavian arteries. No proximal subclavian artery stenosis. Left vertebral artery dominant. Vertebral arteries are patent without stenosis or dissection. Skeleton: No worrisome osseous lesions. Other neck: No other acute finding. Upper chest: No other acute finding. Review of the MIP images confirms the above findings CTA HEAD FINDINGS Anterior circulation: Mild atheromatous change about the carotid siphons without hemodynamically significant stenosis. A1 segments patent bilaterally. Normal anterior communicating  artery complex. Anterior cerebral arteries patent without significant stenosis. Moderate somewhat long segment stenosis involving the proximal left M1 segment (series 7, image 22). Right M1 widely patent. No proximal MCA branch occlusion or high-grade stenosis. Distal MCA branches perfused and symmetric. Posterior circulation: Atheromatous plaque within the right V4 segment without hemodynamically significant stenosis. Left V4 segment widely patent. Left PICA patent. Right PICA origin not well seen. Basilar patent without stenosis. Superior cerebral arteries patent bilaterally. Both PCAs primarily supplied via the basilar and are patent to their distal aspects without significant stenosis. Venous sinuses: Patent allowing for timing the contrast bolus. Anatomic variants: None significant.  No aneurysm. Review of the MIP images confirms the above findings IMPRESSION: 1. Negative CTA for large vessel occlusion or other emergent finding. 2. Atheromatous change about the right carotid bulb with estimated short-segment 50% stenosis at the origin of the cervical right ICA. 3. Moderate proximal left M1 stenosis. 4. Additional mild for age atheromatous change elsewhere about the major arterial vasculature of the head and neck as above.  No other hemodynamically significant or correctable stenosis. Aortic Atherosclerosis (ICD10-I70.0). Electronically Signed   By: Morene Hoard M.D.   On: 06/23/2024 03:41   MR BRAIN WO CONTRAST Result Date: 06/23/2024 CLINICAL DATA:  Initial evaluation for acute neuro deficit, stroke suspected, TIA. EXAM: MRI HEAD WITHOUT CONTRAST TECHNIQUE: Multiplanar, multiecho pulse sequences of the brain and surrounding structures were obtained without intravenous contrast. COMPARISON:  CT from 06/22/2024. FINDINGS: Brain: Cerebral volume within normal limits for age. No focal parenchymal signal abnormality. No abnormal foci of restricted diffusion to suggest acute or subacute ischemia. Gray-white matter differentiation well maintained. No encephalomalacia to suggest chronic cortical infarction or other insult. No foci of susceptibility artifact indicative of acute or chronic intracranial blood products. No mass lesion, midline shift or mass effect. Ventricles normal in size and morphology without hydrocephalus. No extra-axial fluid collection. Pituitary gland and suprasellar region within normal limits. Vascular: Major intracranial vascular flow voids are well maintained. Skull and upper cervical spine: Craniocervical junction within normal limits. Visualized upper cervical spine demonstrates no significant finding. Bone marrow signal intensity within normal limits. No scalp soft tissue abnormality. Sinuses/Orbits: Globes and orbital soft tissues are within normal limits. Paranasal sinuses are largely clear. No significant mastoid effusion. Other: None. IMPRESSION: Normal brain MRI. No acute intracranial abnormality identified. Electronically Signed   By: Morene Hoard M.D.   On: 06/23/2024 01:37      Time coordinating discharge: over 30 minutes  SIGNED:  Lean Fayson DO [facility]

## 2024-06-23 NOTE — Plan of Care (Signed)
  Problem: Education: Goal: Knowledge of disease or condition will improve Outcome: Progressing Goal: Knowledge of secondary prevention will improve (MUST DOCUMENT ALL) Outcome: Progressing Goal: Knowledge of patient specific risk factors will improve (DELETE if not current risk factor) Outcome: Progressing   Problem: Ischemic Stroke/TIA Tissue Perfusion: Goal: Complications of ischemic stroke/TIA will be minimized Outcome: Progressing   Problem: Coping: Goal: Will verbalize positive feelings about self Outcome: Progressing Goal: Will identify appropriate support needs Outcome: Progressing   Problem: Health Behavior/Discharge Planning: Goal: Ability to manage health-related needs will improve Outcome: Progressing Goal: Goals will be collaboratively established with patient/family Outcome: Progressing   Problem: Self-Care: Goal: Ability to participate in self-care as condition permits will improve Outcome: Progressing Goal: Verbalization of feelings and concerns over difficulty with self-care will improve Outcome: Progressing Goal: Ability to communicate needs accurately will improve Outcome: Progressing

## 2024-06-23 NOTE — Discharge Instructions (Addendum)
 Por favor llame a su mdico primario para programar una cita de seguimiento

## 2024-06-24 LAB — HEMOGLOBIN A1C
Hgb A1c MFr Bld: 5.9 % — ABNORMAL HIGH (ref 4.8–5.6)
Mean Plasma Glucose: 123 mg/dL
